# Patient Record
Sex: Female | Born: 1940 | Race: White | Hispanic: No | Smoking: Former smoker
Health system: Southern US, Community
[De-identification: ages and names within clinical notes are randomized; demographics above are authoritative.]

## PROBLEM LIST (undated history)

## (undated) DIAGNOSIS — C679 Malignant neoplasm of bladder, unspecified: Secondary | ICD-10-CM

## (undated) DIAGNOSIS — R350 Frequency of micturition: Secondary | ICD-10-CM

## (undated) DIAGNOSIS — H269 Unspecified cataract: Secondary | ICD-10-CM

## (undated) DIAGNOSIS — C3490 Malignant neoplasm of unspecified part of unspecified bronchus or lung: Secondary | ICD-10-CM

## (undated) DIAGNOSIS — J449 Chronic obstructive pulmonary disease, unspecified: Secondary | ICD-10-CM

## (undated) DIAGNOSIS — R0602 Shortness of breath: Secondary | ICD-10-CM

## (undated) DIAGNOSIS — I10 Essential (primary) hypertension: Secondary | ICD-10-CM

## (undated) DIAGNOSIS — I7121 Aneurysm of the ascending aorta, without rupture: Secondary | ICD-10-CM

## (undated) DIAGNOSIS — Z860100 Personal history of colon polyps, unspecified: Secondary | ICD-10-CM

## (undated) DIAGNOSIS — R351 Nocturia: Secondary | ICD-10-CM

## (undated) DIAGNOSIS — R918 Other nonspecific abnormal finding of lung field: Secondary | ICD-10-CM

## (undated) DIAGNOSIS — I712 Thoracic aortic aneurysm, without rupture: Secondary | ICD-10-CM

## (undated) DIAGNOSIS — M810 Age-related osteoporosis without current pathological fracture: Secondary | ICD-10-CM

## (undated) DIAGNOSIS — M1990 Unspecified osteoarthritis, unspecified site: Secondary | ICD-10-CM

## (undated) DIAGNOSIS — F419 Anxiety disorder, unspecified: Secondary | ICD-10-CM

## (undated) HISTORY — PX: COLONOSCOPY: SHX174

## (undated) HISTORY — PX: TONSILLECTOMY: SUR1361

## (undated) HISTORY — DX: Age-related osteoporosis without current pathological fracture: M81.0

## (undated) HISTORY — DX: Chronic obstructive pulmonary disease, unspecified: J44.9

## (undated) HISTORY — DX: Unspecified cataract: H26.9

## (undated) HISTORY — PX: BLADDER SURGERY: SHX569

## (undated) HISTORY — PX: HAND SURGERY: SHX662

## (undated) HISTORY — PX: CYSTOSCOPY: SUR368

## (undated) HISTORY — PX: CATARACT EXTRACTION, BILATERAL: SHX1313

## (undated) HISTORY — PX: CYSTOSCOPY WITH BIOPSY: SHX5122

## (undated) HISTORY — DX: Malignant neoplasm of bladder, unspecified: C67.9

## (undated) HISTORY — DX: Essential (primary) hypertension: I10

## (undated) HISTORY — DX: Other nonspecific abnormal finding of lung field: R91.8

---

## 1999-09-07 ENCOUNTER — Ambulatory Visit (HOSPITAL_COMMUNITY): Admission: RE | Admit: 1999-09-07 | Discharge: 1999-09-07 | Payer: Self-pay | Admitting: Urology

## 1999-09-07 ENCOUNTER — Encounter (INDEPENDENT_AMBULATORY_CARE_PROVIDER_SITE_OTHER): Payer: Self-pay | Admitting: *Deleted

## 1999-09-07 ENCOUNTER — Encounter: Payer: Self-pay | Admitting: Urology

## 1999-10-13 ENCOUNTER — Ambulatory Visit (HOSPITAL_COMMUNITY): Admission: RE | Admit: 1999-10-13 | Discharge: 1999-10-13 | Payer: Self-pay | Admitting: Interventional Cardiology

## 1999-10-13 ENCOUNTER — Encounter: Payer: Self-pay | Admitting: Interventional Cardiology

## 1999-11-16 ENCOUNTER — Other Ambulatory Visit: Admission: RE | Admit: 1999-11-16 | Discharge: 1999-11-16 | Payer: Self-pay | Admitting: Internal Medicine

## 1999-11-18 ENCOUNTER — Encounter: Payer: Self-pay | Admitting: Internal Medicine

## 1999-11-18 ENCOUNTER — Encounter: Admission: RE | Admit: 1999-11-18 | Discharge: 1999-11-18 | Payer: Self-pay | Admitting: Internal Medicine

## 2000-02-08 ENCOUNTER — Ambulatory Visit (HOSPITAL_COMMUNITY): Admission: RE | Admit: 2000-02-08 | Discharge: 2000-02-08 | Payer: Self-pay | Admitting: Gastroenterology

## 2000-02-08 ENCOUNTER — Encounter (INDEPENDENT_AMBULATORY_CARE_PROVIDER_SITE_OTHER): Payer: Self-pay | Admitting: *Deleted

## 2001-02-21 ENCOUNTER — Ambulatory Visit (HOSPITAL_COMMUNITY): Admission: RE | Admit: 2001-02-21 | Discharge: 2001-02-21 | Payer: Self-pay | Admitting: Sports Medicine

## 2001-02-21 ENCOUNTER — Encounter: Payer: Self-pay | Admitting: Sports Medicine

## 2004-02-17 ENCOUNTER — Other Ambulatory Visit: Admission: RE | Admit: 2004-02-17 | Discharge: 2004-02-17 | Payer: Self-pay | Admitting: Internal Medicine

## 2004-09-29 ENCOUNTER — Emergency Department (HOSPITAL_COMMUNITY): Admission: EM | Admit: 2004-09-29 | Discharge: 2004-09-29 | Payer: Self-pay | Admitting: Family Medicine

## 2004-10-13 ENCOUNTER — Encounter: Admission: RE | Admit: 2004-10-13 | Discharge: 2004-10-28 | Payer: Self-pay | Admitting: Nurse Practitioner

## 2005-02-17 ENCOUNTER — Other Ambulatory Visit: Admission: RE | Admit: 2005-02-17 | Discharge: 2005-02-17 | Payer: Self-pay | Admitting: Internal Medicine

## 2005-04-20 ENCOUNTER — Ambulatory Visit (HOSPITAL_COMMUNITY): Admission: RE | Admit: 2005-04-20 | Discharge: 2005-04-20 | Payer: Self-pay | Admitting: Gastroenterology

## 2006-07-04 ENCOUNTER — Other Ambulatory Visit: Admission: RE | Admit: 2006-07-04 | Discharge: 2006-07-04 | Payer: Self-pay | Admitting: Internal Medicine

## 2007-07-10 ENCOUNTER — Encounter: Payer: Self-pay | Admitting: Urology

## 2007-07-10 ENCOUNTER — Ambulatory Visit (HOSPITAL_BASED_OUTPATIENT_CLINIC_OR_DEPARTMENT_OTHER): Admission: RE | Admit: 2007-07-10 | Discharge: 2007-07-10 | Payer: Self-pay | Admitting: Urology

## 2008-05-05 ENCOUNTER — Other Ambulatory Visit: Admission: RE | Admit: 2008-05-05 | Discharge: 2008-05-05 | Payer: Self-pay | Admitting: Internal Medicine

## 2009-01-30 ENCOUNTER — Emergency Department (HOSPITAL_COMMUNITY): Admission: EM | Admit: 2009-01-30 | Discharge: 2009-01-30 | Payer: Self-pay | Admitting: Family Medicine

## 2010-05-12 ENCOUNTER — Other Ambulatory Visit: Payer: Self-pay | Admitting: Internal Medicine

## 2010-05-12 ENCOUNTER — Other Ambulatory Visit (HOSPITAL_COMMUNITY)
Admission: RE | Admit: 2010-05-12 | Discharge: 2010-05-12 | Disposition: A | Discharge status: Home / Self Care | Payer: Medicare Other | Source: Ambulatory Visit | Attending: Internal Medicine | Admitting: Internal Medicine

## 2010-05-12 DIAGNOSIS — Z1159 Encounter for screening for other viral diseases: Secondary | ICD-10-CM | POA: Insufficient documentation

## 2010-05-12 DIAGNOSIS — Z01419 Encounter for gynecological examination (general) (routine) without abnormal findings: Secondary | ICD-10-CM | POA: Insufficient documentation

## 2010-07-20 NOTE — Op Note (Signed)
NAMEJAQUELINNE, Beverly Francis NO.:  [id number]   MEDICAL RECORD NO.:  [id number]          PATIENT TYPE:  AMB   LOCATION:  [facility]                         FACILITY:  [facility]   PHYSICIAN:  [name]. Annabell Howells, M.D.    DATE OF BIRTH:  1940-04-24   DATE OF PROCEDURE:  07/10/2007  DATE OF DISCHARGE:                               OPERATIVE REPORT   PROCEDURE:  1. Cystoscopy with bilateral retrograde pyelograms and interpretation.  2. Cup biopsy and fulguration of right posterior and mid posterior      bladder wall lesions.   PREOPERATIVE DIAGNOSIS:  History of bladder cancer with bladder wall  lesions.   POSTOPERATIVE DIAGNOSIS:  History of bladder cancer with bladder wall  lesions.   SURGEON:  Bjorn Pippin, MD   ANESTHESIA:  General.   SPECIMEN:  Biopsies from right posterior and mid posterior bladder wall.   COMPLICATIONS:  None.   INDICATIONS:  [name] is a 70 year old white female with a history  of bladder tumors who had her last recurrence in 2004.  On surveillance  cystoscopy, she was found to have 2 small erythematous posterior wall  lesions that were felt to require biopsy.  She had not had upper tract  studies in some time, so it was felt that retrograde pyelography was  indicated as well.   FINDINGS AND PROCEDURE:  The patient is given Cipro.  She was taken to  the operating room, where a general anesthetic was induced.  She was  placed in the lithotomy position.  Her perineum and genitalia were  prepped with Betadine solution and she was draped in the usual sterile  fashion.  Cystoscopy was performed using a 22-French scope and 12- and  70-degrees lenses.  Examination revealed a normal urethra.  The bladder  wall had mild trabeculation.  The mucosa was remarkable only for a small  erythematous lesion on the right posterior wall and on the left  posterior wall; it was difficult to tell if these are neoplastic or  inflammatory.  There is a stellate scar on the  left lateral wall  consistent with her prior biopsy site.  Ureteral orifices were  unremarkable.   After completion of thorough cystoscopic evaluation, she underwent  bilateral retrograde pyelography with a 6-French open-ended catheter and  Hypaque.   The left retrograde pyelogram revealed a normal ureter and intra-renal  collecting system.   The right retrograde pyelogram revealed a normal ureter and intra-renal  collecting system.   After completion of the retrograde pyelograms, cup biopsy forceps were  used to biopsy the lesion on the right posterior wall, which was sent as  a separate specimen, and the mid posterior bladder wall, which was sent  as a separate specimen.   After completion of the biopsies, the biopsy sites were fulgurated with  a Bugbee electrode.  At this point, the bladder was drained, the  cystoscope was removed and the patient was taken down from lithotomy  position, her anesthetic was reversed and she was moved to the recovery  room in stable condition.  There were no complications.  [name]. Annabell Howells, M.D.  Electronically Signed     JJW/MEDQ  D:  07/10/2007  T:  07/10/2007  Job:  [id number]

## 2010-07-23 NOTE — Op Note (Signed)
[place]  Patient:    Beverly Francis, Beverly Francis                    MRN: [id number] Proc. Date: 09/07/99 Adm. Date:  [id number] Disc. Date: [id number] Attending:  Evlyn Clines                           Operative Report  PREOPERATIVE DIAGNOSIS:  Recurrent transitional cell carcinoma of the bladder.  POSTOPERATIVE DIAGNOSIS:  Recurrent transitional cell carcinoma of the bladder.  OPERATION PERFORMED:  Cystoscopy, bilateral retrograde pyelograms, transurethral resection of small bladder tumor.  SURGEON:  [name]. Annabell Howells, M.D.  ANESTHESIA:  General.  SPECIMENS:  Tumor.  COMPLICATIONS:  None.  INDICATIONS FOR PROCEDURE:  The patient is a 70 year old white female with a history of transitional cell carcinoma of the bladder who was noted on surveillance cystoscopy to have a recurrence just medial to the left ureteral orifice.  It appeared low grade.  The patient had not had any upper tract studies in a couple of years and it was felt that bilateral retrogrades were indicated ____________ TURBT.  DESCRIPTION OF PROCEDURE:  The patient was taken to the operating room where general anesthetic was induced.  She had been given p.o. antibiotic preoperatively.  She was placed in lithotomy position.  The perineum and genitalia were prepped with Betadine solution and she was draped in the usual sterile fashion.  Cystoscopy was performed using a 22 [place] scope and 12 and 70 degree lenses.  Examination confirmed the presence of small tumor just medial to the left ureteral orifice.  It was low grade in appearance.  The remainder of the bladder wall was free of tumors.  The ureteral orifices were in their normal anatomic position effluxing clear urine.  The left ureteral orifice was cannulated with a 5 [place] open end catheter.  Contrast was instilled in retrograde fashion.  The study revealed normal ureter and intrarenal collecting system.  The right ureter  was then cannulated.  Contrast was instilled once again.  She was noted to have a normal ureter and intrarenal collecting system.  At this point the cystoscope was removed and a 28 French continuous flow resectoscope sheath was placed.  This was fitted with an [place] handle with 12 degree lens and 24 [place] loop.  Cutting was set on pure.  The tumor was then resected.  The ureteral orifice was avoided. The bed of the tumor was fulgurated as well as the surrounding mucosa and the tumor fragment which was approximately 0.5 cm across was evacuated with a Tuomy syringe. This was sent for permanent section.  Reinspection of the bladder revealed no active bleeding.  The bladder was drained.  The patient was taken down from lithotomy position, her anesthetic was reversed.  She was removed to the recovery room in stable condition.  There were no complications. DD:  09/07/99 TD:  09/08/99 Job: [id number]

## 2010-07-23 NOTE — Op Note (Signed)
Beverly Francis, CORNIA NO.:  [id number]   MEDICAL RECORD NO.:  [id number]          PATIENT TYPE:  AMB   LOCATION:  ENDO                         FACILITY:  [facility]   PHYSICIAN:  Bernette Redbird, M.D.   DATE OF BIRTH:  01-Jan-1941   DATE OF PROCEDURE:  04/20/2005  DATE OF DISCHARGE:                                 OPERATIVE REPORT   PROCEDURE:  Colonoscopy.   INDICATIONS:  Screening for colon cancer in a 70 year old female with  occasional small volume hematochezia.   FINDINGS:  Normal exam except for moderate sigmoid fixation.   PROCEDURE:  The patient provided written consent for the procedure. Sedation  was fentanyl 75 mcg and Versed 7 mg IV without arrhythmias or desaturation.  The Olympus adjustable tension pediatric video colonoscope was advanced with  moderate difficulty through somewhat fixated angulated sigmoid region, but  with the patient in the supine position and applying some external abdominal  compression I was unstable to enter the terminal ileum which had normal  appearance and pullback was then performed. The quality of prep was  excellent and it is felt that all areas were well seen.   This was normal examination. No polyps, cancer, colitis, vascular  malformations or diverticulosis were noted. Retroflexion in the rectum was  unremarkable as was pullout through the anal canal. I did not see any  evident source of rectal bleeding, with just mild to moderate internal  hemorrhoids been noted.   No biopsies were obtained. The patient tolerated procedure well and there  were no apparent complications.   IMPRESSION:  Normal screening colonoscopy.   PLAN:  Repeat colonoscopy in 10 years, or possibly sooner if the rectal  bleeding persists.           ______________________________  Bernette Redbird, M.D.     RB/MEDQ  D:  04/20/2005  T:  04/20/2005  Job:  [id number]   cc:   Ladell Pier, M.D.  Fax: [phone]

## 2012-07-24 ENCOUNTER — Other Ambulatory Visit: Payer: Self-pay | Admitting: Internal Medicine

## 2012-07-24 ENCOUNTER — Other Ambulatory Visit (HOSPITAL_COMMUNITY)
Admission: RE | Admit: 2012-07-24 | Discharge: 2012-07-24 | Disposition: A | Discharge status: Home / Self Care | Payer: Medicare Other | Source: Ambulatory Visit | Attending: Internal Medicine | Admitting: Internal Medicine

## 2012-07-24 DIAGNOSIS — Z124 Encounter for screening for malignant neoplasm of cervix: Secondary | ICD-10-CM | POA: Insufficient documentation

## 2013-06-25 ENCOUNTER — Other Ambulatory Visit: Payer: Self-pay | Admitting: Sports Medicine

## 2013-06-25 DIAGNOSIS — M25559 Pain in unspecified hip: Secondary | ICD-10-CM

## 2013-06-25 DIAGNOSIS — M25551 Pain in right hip: Secondary | ICD-10-CM

## 2013-06-25 DIAGNOSIS — M545 Low back pain, unspecified: Secondary | ICD-10-CM

## 2013-06-25 DIAGNOSIS — M25552 Pain in left hip: Principal | ICD-10-CM

## 2013-06-26 ENCOUNTER — Ambulatory Visit
Admission: RE | Admit: 2013-06-26 | Discharge: 2013-06-26 | Disposition: A | Discharge status: Home / Self Care | Payer: Medicare Other | Source: Ambulatory Visit | Attending: Sports Medicine | Admitting: Sports Medicine

## 2013-06-26 DIAGNOSIS — M25552 Pain in left hip: Principal | ICD-10-CM

## 2013-06-26 DIAGNOSIS — M25551 Pain in right hip: Secondary | ICD-10-CM

## 2013-07-02 ENCOUNTER — Other Ambulatory Visit: Payer: Medicare Other

## 2013-09-02 ENCOUNTER — Other Ambulatory Visit: Payer: Self-pay | Admitting: Internal Medicine

## 2013-09-02 DIAGNOSIS — J449 Chronic obstructive pulmonary disease, unspecified: Secondary | ICD-10-CM

## 2013-09-02 DIAGNOSIS — Z720 Tobacco use: Secondary | ICD-10-CM

## 2013-09-09 ENCOUNTER — Ambulatory Visit
Admission: RE | Admit: 2013-09-09 | Discharge: 2013-09-09 | Disposition: A | Discharge status: Home / Self Care | Payer: Medicare Other | Source: Ambulatory Visit | Attending: Internal Medicine | Admitting: Internal Medicine

## 2013-09-09 DIAGNOSIS — J449 Chronic obstructive pulmonary disease, unspecified: Secondary | ICD-10-CM

## 2013-09-09 DIAGNOSIS — Z720 Tobacco use: Secondary | ICD-10-CM

## 2013-09-11 ENCOUNTER — Other Ambulatory Visit (HOSPITAL_COMMUNITY): Payer: Self-pay | Admitting: Internal Medicine

## 2013-09-11 DIAGNOSIS — R918 Other nonspecific abnormal finding of lung field: Secondary | ICD-10-CM

## 2013-09-16 ENCOUNTER — Encounter (HOSPITAL_COMMUNITY)
Admission: RE | Admit: 2013-09-16 | Discharge: 2013-09-16 | Disposition: A | Discharge status: Home / Self Care | Payer: Medicare Other | Source: Ambulatory Visit | Attending: Internal Medicine | Admitting: Internal Medicine

## 2013-09-16 ENCOUNTER — Telehealth: Payer: Self-pay | Admitting: *Deleted

## 2013-09-16 DIAGNOSIS — R222 Localized swelling, mass and lump, trunk: Secondary | ICD-10-CM | POA: Insufficient documentation

## 2013-09-16 DIAGNOSIS — R918 Other nonspecific abnormal finding of lung field: Secondary | ICD-10-CM

## 2013-09-16 LAB — GLUCOSE, CAPILLARY: Glucose-Capillary: 100 mg/dL — ABNORMAL HIGH (ref 70–99)

## 2013-09-16 MED ORDER — FLUDEOXYGLUCOSE F - 18 (FDG) INJECTION: 5.1000 | Freq: Once | INTRAVENOUS | Status: AC | PRN

## 2013-09-16 NOTE — Telephone Encounter (Signed)
Called pt with appt for [place] 09/19/13 unable to reach or leave vm message

## 2013-09-16 NOTE — Telephone Encounter (Signed)
Called cell phone and busy signal received.

## 2013-09-16 NOTE — Telephone Encounter (Signed)
Called family to make appt for [facility] 09/19/13

## 2013-09-17 ENCOUNTER — Telehealth: Payer: Self-pay | Admitting: *Deleted

## 2013-09-17 NOTE — Telephone Encounter (Signed)
Gave appt time and place for [facility] 09/19/13 at 3:45

## 2013-09-17 NOTE — Telephone Encounter (Signed)
Called pt unable to reach

## 2013-09-19 ENCOUNTER — Ambulatory Visit: Payer: Medicare Other | Attending: Surgery | Admitting: Physical Therapy

## 2013-09-19 ENCOUNTER — Institutional Professional Consult (permissible substitution) (INDEPENDENT_AMBULATORY_CARE_PROVIDER_SITE_OTHER): Payer: Medicare Other | Admitting: Thoracic Surgery (Cardiothoracic Vascular Surgery)

## 2013-09-19 ENCOUNTER — Other Ambulatory Visit: Payer: Self-pay

## 2013-09-19 ENCOUNTER — Encounter: Payer: Self-pay | Admitting: Thoracic Surgery (Cardiothoracic Vascular Surgery)

## 2013-09-19 VITALS — BP 110/69 | HR 104 | Resp 20 | Ht 61.0 in | Wt 105.0 lb

## 2013-09-19 DIAGNOSIS — I10 Essential (primary) hypertension: Secondary | ICD-10-CM

## 2013-09-19 DIAGNOSIS — J439 Emphysema, unspecified: Secondary | ICD-10-CM | POA: Insufficient documentation

## 2013-09-19 DIAGNOSIS — J438 Other emphysema: Secondary | ICD-10-CM

## 2013-09-19 DIAGNOSIS — C679 Malignant neoplasm of bladder, unspecified: Secondary | ICD-10-CM | POA: Insufficient documentation

## 2013-09-19 DIAGNOSIS — J432 Centrilobular emphysema: Secondary | ICD-10-CM

## 2013-09-19 DIAGNOSIS — M810 Age-related osteoporosis without current pathological fracture: Secondary | ICD-10-CM

## 2013-09-19 DIAGNOSIS — R222 Localized swelling, mass and lump, trunk: Secondary | ICD-10-CM

## 2013-09-19 DIAGNOSIS — C3490 Malignant neoplasm of unspecified part of unspecified bronchus or lung: Secondary | ICD-10-CM | POA: Insufficient documentation

## 2013-09-19 DIAGNOSIS — R918 Other nonspecific abnormal finding of lung field: Secondary | ICD-10-CM

## 2013-09-19 NOTE — Progress Notes (Signed)
PCP is No primary provider on file. Referring Provider is Polite, Ashby Dawes, MD  Chief Complaint  Patient presents with  . Lung Mass    surgical eval on lung mass, Chest CT 09/09/13, PET Scan 09/16/13    HPI: 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  She recently went to Dr. Delfina Redwood for an annual physical. While there she complained that she was having some shortness of breath. She also was noted to have lost a significant amount of weight. A CT of the chest was done. It showed a 2.5 cm spiculated mass in the superior segment of the left lower lobe. There is a smaller satellite lesion 6 mm in diameter in close proximity. There was no hilar or mediastinal adenopathy. She then had a PET/CT which showed the mass to be hypermetabolic. There was no evidence of regional or distant metastatic disease.  She has smoked about a pack a day for the past 50 years. She says she has been trying to cut back and is down to less than half a pack a day currently. She was given a prescription for Chantix, but has only taken of one of those tablets. She has a chronic cough which is not changed recently. She denies hemoptysis. She does have wheezing. She says that she seldom uses her Advair, but uses her Ventolin for wheezing almost every day. She has lost 7 pounds over the past 6 months, but her weight has been stable over the past 3 months.  She does complain of chest pressure and shortness of breath with exertion. She says that she walks 3 dogs and can go about a mile with them. She also can go up a flight of stairs without difficulty. However when she first starts her walks after a few minutes she'll experience pressure in her chest and shortness of breath. If she then slows down, she can continue walking and her breathing is better as she continues to go along.    Past Medical History  Diagnosis  Date  . Lung mass   . Tobacco abuse   . Hypertension   . COPD (chronic obstructive pulmonary disease)   . Osteoporosis   . Depressed   . Weight loss   . Bladder cancer   . Cataracts, bilateral   . Seasonal allergies     Past Surgical History  Procedure Laterality Date  . Bladder surgery      1996 Dr Jeffie Pollock  . Hand surgery      1996, 2001    Family History  Problem Relation Age of Onset  . Pulmonary embolism Father   . CVA Mother   . Heart disease Brother   . Lung cancer Sister   . Renal cancer Sister     Social History History  Substance Use Topics  . Smoking status: Current Every Day Smoker -- 1.00 packs/day    Types: Cigarettes  . Smokeless tobacco: Not on file  . Alcohol Use: Yes    Current Outpatient Prescriptions  Medication Sig Dispense Refill  . ADVAIR DISKUS 250-50 MCG/DOSE AEPB Inhale 1 puff into the lungs 2 (two) times daily.       Marland Kitchen ALPRAZolam (XANAX) 0.25 MG tablet Take 0.25 mg by mouth at bedtime as needed for anxiety.      Marland Kitchen aspirin EC 81 MG tablet Take 81 mg by mouth daily.      Marland Kitchen  benazepril-hydrochlorthiazide (LOTENSIN HCT) 10-12.5 MG per tablet Take 1 tablet by mouth daily.      . Calcium 600-200 MG-UNIT per tablet Take 1 tablet by mouth daily.      Marland Kitchen EPIPEN 2-PAK 0.3 MG/0.3ML SOAJ injection 0.3 mg once. prn      . ergocalciferol (VITAMIN D2) 50000 UNITS capsule Take 50,000 Units by mouth once a week.      . VENTOLIN HFA 108 (90 BASE) MCG/ACT inhaler Inhale 1-2 puffs into the lungs every 6 (six) hours as needed.       . CHANTIX STARTING MONTH PAK 0.5 MG X 11 & 1 MG X 42 tablet        No current facility-administered medications for this visit.    Allergies  Allergen Reactions  . Bee Venom   . Oxycodone Other (See Comments)    Facial numbness    Review of Systems  Constitutional: Positive for unexpected weight change (lost 7 pounds in 6 montyhs, no change in 3 months).  Respiratory: Positive for cough, shortness of breath (with exertion)  and wheezing.   Cardiovascular: Positive for chest pain (chest pressure with exertion).  Genitourinary: Positive for frequency.  Neurological: Negative.   Hematological: Negative for adenopathy. Does not bruise/bleed easily.  All other systems reviewed and are negative.   BP 110/69  Pulse 104  Resp 20  Ht 5\' 1"  (1.549 m)  Wt 105 lb (47.628 kg)  BMI 19.85 kg/m2  SpO2 95% Physical Exam  Vitals reviewed. Constitutional: She is oriented to person, place, and time. She appears well-developed. No distress.  Thin  HENT:  Head: Normocephalic and atraumatic.  Eyes: EOM are normal. Pupils are equal, round, and reactive to light.  Neck: Neck supple. No thyromegaly present.  Cardiovascular: Normal rate, normal heart sounds and intact distal pulses.   No murmur heard. Slightly irregular  Pulmonary/Chest: Effort normal and breath sounds normal. She has no wheezes. She has no rales.  Abdominal: Soft. There is no tenderness.  Musculoskeletal: Normal range of motion. She exhibits no edema.  Lymphadenopathy:    She has no cervical adenopathy.  Neurological: She is alert and oriented to person, place, and time. No cranial nerve deficit.  No focal motor deficit  Skin: Skin is warm and dry.     Diagnostic Tests: NUCLEAR MEDICINE PET SKULL BASE TO THIGH  TECHNIQUE:  5.1 mCi F-18 FDG was injected intravenously. Full-ring PET imaging  was performed from the skull base to thigh after the radiotracer. CT  data was obtained and used for attenuation correction and anatomic  localization.  FASTING BLOOD GLUCOSE: Value: 100 mg/dl  COMPARISON: CT chest from 09/09/2013.  FINDINGS:  NECK  No hypermetabolic lymph nodes in the neck.  CHEST  The 2.5 x 2.5 cm spiculated irregular nodule in the superior segment  of the left lower lobe is hypermetabolic with SUV max = 9.3. There  is no hypermetabolic left hilar or mediastinal lymphadenopathy.  ABDOMEN/PELVIS  No abnormal hypermetabolic activity within  the liver, pancreas,  adrenal glands, or spleen. No hypermetabolic lymph nodes in the  abdomen or pelvis. Small focus of FDG accumulation along the left  pelvic sidewall is felt represent urine within the left ureter.  2.5 cm water density lesion in the right kidney is not  hypermetabolic and has imaging features compatible with a cyst.  Atherosclerotic calcification noted in the abdominal aorta without  aneurysm.  SKELETON  No focal hypermetabolic activity to suggest skeletal metastasis.  IMPRESSION:  2.5 cm spiculated  nodule in the left lower lobe is hypermetabolic,  consistent with primary bronchogenic carcinoma. No evidence for  hypermetabolic lymphadenopathy in the left hilum or mediastinum. No  evidence for distant metastatic involvement.  Electronically Signed  By: Misty Stanley M.D.  On: 09/16/2013 14:49   Impression: 73 year old woman with a history of tobacco abuse and COPD who is now found to have a 2.5 cm spiculated mass in the superior segment of the left lower lobe. This is hypermetabolic on PET CT. It is highly suspicious for a new primary bronchogenic carcinoma and needs to be treated as such unless it can be proven otherwise. She does have a history of bladder cancer which is been treated with transurethral resections in the past. The lung mass does not appear to be a metastatic lesion.   I think that she is likely a candidate for surgical resection. She does need a formal pulmonary function testing both with and without bronchodilators to assess her pulmonary reserve. We will schedule those.  I also think she should be seen by cardiology preoperatively for clearance. She does not have a history of coronary disease, but her chest pressure and shortness of breath at the beginning of exercise along with the finding of significant coronary calcification on the CT scan do cause some concern.  I did discuss with her potential options for treatment. We discussed the relative  advantages and disadvantages of surgery versus radiation therapy. She is not think that she would be imaged and radiation therapy, even if we determined she was not surgical candidate for some other reason.  My recommendation to her was that we would do a left video-assisted thoracoscopy, wedge resection, possible segmentectomy or lobectomy (depending on PFTs). We also discussed cryo-analgesia of the intercostal nerves for postoperative pain relief.  I discussed the proposed operation with her in detail. We discussed the need for general anesthesia, the incisions to be used, the intraoperative decision making based on frozen section findings, the expected hospital stay and overall recovery. We discussed the indications, risks, benefits, and alternatives (as described above). She understands that the risks of surgery include, but are not limited to death, MI, DVT, PE, bleeding, possible need for blood transfusion, infection, prolonged air leaks, cardiac arrhythmias, as well as the possibility of unforeseeable complications. She is interested in proceeding with surgical resection.  Plan: 1. Cardiology consultation for preoperative clearance  2. Pulmonary function testing with and without bronchodilators  3. Return to office to review the results and discuss timing of surgery.  4. Left VATS, wedge resection, possible segmentectomy or lobectomy, cryo- analgesia of intercostal nerves.

## 2013-09-20 ENCOUNTER — Other Ambulatory Visit: Payer: Self-pay | Admitting: *Deleted

## 2013-09-20 DIAGNOSIS — R222 Localized swelling, mass and lump, trunk: Secondary | ICD-10-CM

## 2013-10-03 ENCOUNTER — Encounter: Payer: Self-pay | Admitting: Interventional Cardiology

## 2013-10-03 ENCOUNTER — Ambulatory Visit (INDEPENDENT_AMBULATORY_CARE_PROVIDER_SITE_OTHER): Payer: Medicare Other | Admitting: Interventional Cardiology

## 2013-10-03 VITALS — BP 116/72 | HR 86 | Ht 63.5 in | Wt 106.0 lb

## 2013-10-03 DIAGNOSIS — R918 Other nonspecific abnormal finding of lung field: Secondary | ICD-10-CM

## 2013-10-03 DIAGNOSIS — I10 Essential (primary) hypertension: Secondary | ICD-10-CM

## 2013-10-03 DIAGNOSIS — R0789 Other chest pain: Secondary | ICD-10-CM

## 2013-10-03 DIAGNOSIS — J438 Other emphysema: Secondary | ICD-10-CM

## 2013-10-03 DIAGNOSIS — I2510 Atherosclerotic heart disease of native coronary artery without angina pectoris: Secondary | ICD-10-CM

## 2013-10-03 DIAGNOSIS — I2584 Coronary atherosclerosis due to calcified coronary lesion: Secondary | ICD-10-CM

## 2013-10-03 DIAGNOSIS — R9431 Abnormal electrocardiogram [ECG] [EKG]: Secondary | ICD-10-CM

## 2013-10-03 DIAGNOSIS — R222 Localized swelling, mass and lump, trunk: Secondary | ICD-10-CM

## 2013-10-03 DIAGNOSIS — J432 Centrilobular emphysema: Secondary | ICD-10-CM

## 2013-10-03 NOTE — Patient Instructions (Signed)
Your physician recommends that you continue on your current medications as directed. Please refer to the Current Medication list given to you today.  Your physician has requested that you have en exercise stress myoview. For further information please visit [email or web address]. Please follow instruction sheet, as given.  Your physician recommends that you schedule a follow-up appointment as needed

## 2013-10-03 NOTE — Progress Notes (Signed)
Patient ID: Beverly Francis, female   DOB: Jan 20, 1941, 73 y.o.   MRN: [id number]    [address], [address] [place], [place]  [address] Phone: [phone] Fax:  [phone]  Date:  10/03/2013   ID:  Beverly Francis, DOB 11-Sep-1940, MRN [id number]  PCP:  No primary provider on file.   ASSESSMENT:  1.Preoperative cardiovascular clearance prior to left lung resection for suspected cancer 2. Coronary artery disease noted by coronary calcifications on recent CT scan, three-vessel 3. Recurring episodes of chest tightness and dyspnea of uncertain etiology. Given her age and coronary calcification, CAD needs to be excluded 4. Abnormal EKG with right axis and inferior Q waves possibly suggesting prior inferior infarct 5. Left upper lobe lung mass, suspected to be cancer 6. Continued tobacco use 7. Hypertension  PLAN:  1.Stress Cardiolite to risk stratify for the presence of high risk coronary disease 2. Baby aspirin daily 3. Clearance for pulmonary resection will be dependent upon cardiac workup. I am highly suspicious that she will have coronary obstructive disease but hopefully low risk in functional category. 4. 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 Of note her for quite some time dating back to when she worked at: [employer]. She has a continuous tightness in the chest intermittently over the past 3 years. There is no exertional component. She was recently found to have an abnormal chest x-ray with a left upper lobe lesion. This is been confirmed by chest CT. Chest CT demonstrated three-vessel coronary calcification. I initially saw the patient 19 years ago because of a EKG that gave the impression of possible inferior infarction. She has never had a clinically significant cardiac presentation. There is no history of myocardial infarction. She has been relatively active and is limited now by dyspnea which she feels is  related to smoking. She denies edema. There is no orthopnea. She does not have palpitations. She has never had syncope.   Wt Readings from Last 3 Encounters:  10/03/13 106 lb (48.081 kg)  09/19/13 105 lb (47.628 kg)     Past Medical History  Diagnosis Date  . Lung mass   . Tobacco abuse   . Hypertension   . COPD (chronic obstructive pulmonary disease)   . Osteoporosis   . Depressed   . Weight loss   . Bladder cancer   . Cataracts, bilateral   . Seasonal allergies     Current Outpatient Prescriptions  Medication Sig Dispense Refill  . ADVAIR DISKUS 250-50 MCG/DOSE AEPB Inhale 1 puff into the lungs 2 (two) times daily.       Marland Kitchen ALPRAZolam (XANAX) 0.25 MG tablet Take 0.25 mg by mouth at bedtime as needed for anxiety.      Marland Kitchen aspirin EC 81 MG tablet Take 81 mg by mouth daily.      . benazepril-hydrochlorthiazide (LOTENSIN HCT) 10-12.5 MG per tablet Take 1 tablet by mouth daily.      . Calcium 600-200 MG-UNIT per tablet Take 1 tablet by mouth daily.      . CHANTIX STARTING MONTH PAK 0.5 MG X 11 & 1 MG X 42 tablet       . EPIPEN 2-PAK 0.3 MG/0.3ML SOAJ injection 0.3 mg once. prn      . ergocalciferol (VITAMIN D2) 50000 UNITS capsule Take 50,000 Units by mouth once a week.      Marland Kitchen ibuprofen (HM IBUPROFEN IB) 200 MG tablet Take 600  mg by mouth daily.      . VENTOLIN HFA 108 (90 BASE) MCG/ACT inhaler Inhale 1-2 puffs into the lungs every 6 (six) hours as needed.        No current facility-administered medications for this visit.    Allergies:    Allergies  Allergen Reactions  . Bee Venom   . Oxycodone Other (See Comments)    Facial numbness    Social History:  The patient  reports that she has been smoking Cigarettes.  She has a 50 pack-year smoking history. She does not have any smokeless tobacco history on file. She reports that she drinks alcohol. She reports that she does not use illicit drugs.   ROS:  Please see the history of present illness.   Weight loss. Appetite remains  good. Chronic back element. Continued cigarette smoking. Denies transient neurological symptoms. Is no peripheral edema, abdominal swelling, or orthopnea   All other systems reviewed and negative.   OBJECTIVE: VS:  Ht 5' 3.5" (1.613 m)  Wt 106 lb (48.081 kg)  BMI 18.48 kg/m2 Well nourished, well developed, in no acute distress, : As stated age and appears chronically ill HEENT: normal Neck: JVD flat. Carotid bruit absent  Cardiac:  normal S1, S2; RRR; no murmur Lungs:  clear to auscultation bilaterally, no wheezing, rhonchi or rales Abd: soft, nontender, no hepatomegaly Ext: Edema absent. Pulses 2+ upper extremities bilateral Skin: warm and dry Neuro:  CNs 2-12 intact, no focal abnormalities noted  EKG:  Inferior infarct, poor R-wave wave progression, rightward axis, and normal sinus rhythm. Inferior Q waves or deeper and wider than on prior tracing from 2001 at [facility] cardiology       Signed, Holloway, MD 10/03/2013 1:27 PM

## 2013-10-04 ENCOUNTER — Ambulatory Visit (HOSPITAL_COMMUNITY)
Admission: RE | Admit: 2013-10-04 | Discharge: 2013-10-04 | Disposition: A | Discharge status: Home / Self Care | Payer: Medicare Other | Source: Ambulatory Visit | Attending: Thoracic Surgery (Cardiothoracic Vascular Surgery) | Admitting: Thoracic Surgery (Cardiothoracic Vascular Surgery)

## 2013-10-04 DIAGNOSIS — R222 Localized swelling, mass and lump, trunk: Secondary | ICD-10-CM | POA: Diagnosis not present

## 2013-10-04 MED ORDER — ALBUTEROL SULFATE (2.5 MG/3ML) 0.083% IN NEBU
2.5000 mg | INHALATION_SOLUTION | Freq: Once | RESPIRATORY_TRACT | Status: AC
  Administered 2013-10-04: 2.5 mg via RESPIRATORY_TRACT

## 2013-10-07 LAB — PULMONARY FUNCTION TEST
DL/VA % pred: 64 %
DL/VA: 3.01 ml/min/mmHg/L
DLCO COR % PRED: 57 %
DLCO UNC: 13.12 ml/min/mmHg
DLCO cor: 13.12 ml/min/mmHg
DLCO unc % pred: 57 %
FEF 25-75 POST: 0.44 L/s
FEF 25-75 PRE: 0.4 L/s
FEF2575-%Change-Post: 9 %
FEF2575-%PRED-PRE: 23 %
FEF2575-%Pred-Post: 25 %
FEV1-%Change-Post: -3 %
FEV1-%Pred-Post: 49 %
FEV1-%Pred-Pre: 50 %
FEV1-Post: 1.02 L
FEV1-Pre: 1.06 L
FEV1FVC-%Change-Post: -3 %
FEV1FVC-%Pred-Pre: 65 %
FEV6-%CHANGE-POST: 1 %
FEV6-%PRED-POST: 80 %
FEV6-%PRED-PRE: 79 %
FEV6-POST: 2.11 L
FEV6-Pre: 2.08 L
FEV6FVC-%Change-Post: 1 %
FEV6FVC-%PRED-POST: 103 %
FEV6FVC-%Pred-Pre: 101 %
FVC-%CHANGE-POST: 0 %
FVC-%PRED-POST: 77 %
FVC-%PRED-PRE: 77 %
FVC-POST: 2.15 L
FVC-PRE: 2.15 L
PRE FEV1/FVC RATIO: 49 %
Post FEV1/FVC ratio: 47 %
Post FEV6/FVC ratio: 98 %
Pre FEV6/FVC Ratio: 97 %
RV % pred: 180 %
RV: 3.97 L
TLC % pred: 128 %
TLC: 6.28 L

## 2013-10-09 ENCOUNTER — Ambulatory Visit (HOSPITAL_COMMUNITY): Payer: Medicare Other | Attending: Cardiovascular Disease | Admitting: Radiology

## 2013-10-09 DIAGNOSIS — I2584 Coronary atherosclerosis due to calcified coronary lesion: Principal | ICD-10-CM

## 2013-10-09 DIAGNOSIS — I2510 Atherosclerotic heart disease of native coronary artery without angina pectoris: Secondary | ICD-10-CM

## 2013-10-09 MED ORDER — TECHNETIUM TC 99M SESTAMIBI GENERIC - CARDIOLITE
30.0000 | Freq: Once | INTRAVENOUS | Status: AC | PRN
  Administered 2013-10-09: 30 via INTRAVENOUS

## 2013-10-10 ENCOUNTER — Ambulatory Visit (HOSPITAL_COMMUNITY): Payer: Medicare Other | Attending: Cardiology | Admitting: Radiology

## 2013-10-10 VITALS — BP 104/65 | HR 66 | Ht 64.0 in | Wt 104.0 lb

## 2013-10-10 DIAGNOSIS — F17200 Nicotine dependence, unspecified, uncomplicated: Secondary | ICD-10-CM | POA: Diagnosis not present

## 2013-10-10 DIAGNOSIS — R0609 Other forms of dyspnea: Secondary | ICD-10-CM | POA: Diagnosis not present

## 2013-10-10 DIAGNOSIS — R0989 Other specified symptoms and signs involving the circulatory and respiratory systems: Secondary | ICD-10-CM | POA: Insufficient documentation

## 2013-10-10 DIAGNOSIS — R079 Chest pain, unspecified: Secondary | ICD-10-CM | POA: Insufficient documentation

## 2013-10-10 DIAGNOSIS — R0602 Shortness of breath: Secondary | ICD-10-CM

## 2013-10-10 DIAGNOSIS — I10 Essential (primary) hypertension: Secondary | ICD-10-CM | POA: Insufficient documentation

## 2013-10-10 DIAGNOSIS — I4949 Other premature depolarization: Secondary | ICD-10-CM

## 2013-10-10 MED ORDER — TECHNETIUM TC 99M SESTAMIBI GENERIC - CARDIOLITE
30.0000 | Freq: Once | INTRAVENOUS | Status: AC | PRN
  Administered 2013-10-10: 30 via INTRAVENOUS

## 2013-10-10 NOTE — Progress Notes (Signed)
[facility] [place], [place] [address] [phone]    Cardiology Nuclear Med Study  Beverly Francis is a 73 y.o. female     MRN : [id number]     DOB: Aug 08, 1940  Procedure Date: 10/10/2013  Nuclear Med Background Indication for Stress Test:  Evaluation for Ischemia, Abnormal EKG, 09-02-2013 CT Chest 3-vessel CAD, and Pending Surgical Clearance for  (L) Lung Resection by Mod[place] Charon, MD History: No prior known history of CAD, 2001 Myocardial Perfusion Imaging-Normal, EF=64% Cardiac Risk Factors: Hypertension and Smoker  Symptoms: Chest Tightness, and  DOE   Nuclear Pre-Procedure Caffeine/Decaff Intake:  None > 12 hrs NPO After: 7:30pm   Lungs:  clear O2 Sat: 98% on room air. IV 0.9% NS with Angio Cath:  22g  IV Site: R Antecubital x 1, tolerated well IV Started by:  Irven Baltimore, RN  Chest Size (in):  34 Cup Size: D  Height: 5\' 4"  (1.626 m)  Weight:  104 lb (47.174 kg)  BMI:  Body mass index is 17.84 kg/(m^2). Tech Comments:  No medications today. Irven Baltimore, RN.    Nuclear Med Study 1 or 2 day study: 2 day  Stress Test Type:  Stress  Reading MD: N/A  Order Authorizing Provider:  Olen Pel MD  Resting Radionuclide: Technetium 45m Sestamibi  Resting Radionuclide Dose: 33.0 mCi  On     10-09-13  Stress Radionuclide:  Technetium 47m Sestamibi  Stress Radionuclide Dose: 33.0 mCi on          10-10-13          Stress Protocol Rest HR: 66 Stress HR: 129  Rest BP: 104/65 Stress BP: 188/90  Exercise Time (min): 4:16 METS: 6.10   Predicted Max HR: 147 bpm % Max HR: 87.76 bpm Rate Pressure Product: 24252   Dose of Adenosine (mg):  n/a Dose of Lexiscan: n/a mg  Dose of Atropine (mg): n/a Dose of Dobutamine: n/a mcg/kg/min (at max HR)  Stress Test Technologist: Perrin Maltese, EMT-P  Nuclear Technologist:  Vedia Pereyra, CNMT     Rest Procedure:  Myocardial perfusion imaging was performed at rest 45 minutes following  the intravenous administration of Technetium 53m Sestamibi. Rest ECG: NSR - Normal EKG  Stress Procedure:  The patient exercised on the treadmill utilizing the Bruce Protocol for 4:16 minutes. The patient stopped due to DOE and denied any chest pain.  Technetium 15m Sestamibi was injected at peak exercise and myocardial perfusion imaging was performed after a brief delay. Stress ECG: No significant ST segment change suggestive of ischemia. and Short run of PAT noted, occasional PVC's during recovery  QPS Raw Data Images:  Normal; no motion artifact; normal heart/lung ratio. Stress Images:  Normal homogeneous uptake in all areas of the myocardium. Rest Images:  Normal homogeneous uptake in all areas of the myocardium. Subtraction (SDS):  No evidence of ischemia. Transient Ischemic Dilatation (Normal <1.22):  1.09 Lung/Heart Ratio (Normal <0.45):  0.25  Quantitative Gated Spect Images QGS EDV:  60 ml QGS ESV:  15 ml  Impression Exercise Capacity:  Poor exercise capacity. BP Response:  Hypertensive blood pressure response. Clinical Symptoms:  There is dyspnea. ECG Impression:  No ischemic EKG changes, 15-beat run of PAT, scattered PVC's Comparison with Prior Nuclear Study: No images to compare  Overall Impression:  Normal stress nuclear study.  Short ~15 beat run of PAT noted during recovery, self-terminated. Occasional PVC's.  LV Ejection Fraction: 74%.  LV Wall Motion:  Normal Wall Motion  Pixie Casino, MD, [facility] Board Certified in Nuclear Cardiology Attending Cardiologist [facility]

## 2013-10-14 ENCOUNTER — Telehealth: Payer: Self-pay | Admitting: Interventional Cardiology

## 2013-10-14 NOTE — Telephone Encounter (Signed)
The stress myocardial perfusion study is unremarkable on 10/11/13: Overall Impression: Normal stress nuclear study. Short ~15 beat run of PAT noted during recovery, self-terminated. Occasional PVC's.  LV Ejection Fraction: 74%. LV Wall Motion: Normal Wall Motion   The patient is Cleared for upcoming pulmonary nodule resection surgery from cardiac standpoint.

## 2013-10-15 ENCOUNTER — Telehealth: Payer: Self-pay

## 2013-10-15 NOTE — Telephone Encounter (Signed)
lmom. myoview was Normal study. Cleared for the upcoming lung resectio

## 2013-10-15 NOTE — Telephone Encounter (Signed)
Message copied by Lamar Laundry on Tue Oct 15, 2013  2:02 PM ------      Message from: Daneen Schick      Created: Fri Oct 11, 2013 10:51 AM       Normal study. Cleared for the upcoming lung resection ------

## 2013-10-15 NOTE — Telephone Encounter (Signed)
Done.Faxed to Dr.Hendrickson's office

## 2013-10-24 ENCOUNTER — Encounter: Payer: Self-pay | Admitting: Thoracic Surgery (Cardiothoracic Vascular Surgery)

## 2013-10-24 ENCOUNTER — Ambulatory Visit (INDEPENDENT_AMBULATORY_CARE_PROVIDER_SITE_OTHER): Payer: Medicare Other | Admitting: Thoracic Surgery (Cardiothoracic Vascular Surgery)

## 2013-10-24 VITALS — BP 125/80 | HR 108 | Ht 64.0 in | Wt 104.0 lb

## 2013-10-24 DIAGNOSIS — R911 Solitary pulmonary nodule: Secondary | ICD-10-CM

## 2013-10-24 MED ORDER — ALPRAZOLAM 0.25 MG PO TABS: 0.2500 mg | ORAL_TABLET | Freq: Every evening | ORAL | Status: DC | PRN

## 2013-10-24 NOTE — Progress Notes (Signed)
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   She recently went to Dr. Delfina Redwood for an annual physical. While there she complained that she was having some shortness of breath. She also was noted to have lost a significant amount of weight. A CT of the chest was done. It showed a 2.5 cm spiculated mass in the superior segment of the left lower lobe. There is a smaller satellite lesion 6 mm in diameter in close proximity. There was no hilar or mediastinal adenopathy. She then had a PET/CT which showed the mass to be hypermetabolic. There was no evidence of regional or distant metastatic disease.   She has smoked about a pack a day for the past 50 years. She says she has been trying to cut back and is down to less than half a pack a day currently. She was given a prescription for Chantix, but has only taken of one of those tablets. She has a chronic cough which is not changed recently. She denies hemoptysis. She does have wheezing. She says that she seldom uses her Advair, but uses her Ventolin for wheezing almost every day. She has lost 7 pounds over the past 6 months, but her weight has been stable over the past 3 months.   She does complain of chest pressure and shortness of breath with exertion. She says that she walks 3 dogs and can go about a mile with them. She also can go up a flight of stairs without difficulty. However when she first starts her walks after a few minutes she'll experience pressure in her chest and shortness of breath. If she then slows down, she can continue walking and her breathing is better as she continues to go along.  I saw her in the office and recommended surgical resection pending PFT and cariology clearance.  Her stress test was interpreted as a low risk study and she was cleared for surgery.  Past Medical History  Diagnosis Date  . Lung mass    . Tobacco abuse   . Hypertension   . COPD (chronic obstructive pulmonary disease)   . Osteoporosis   . Depressed   . Weight loss   . Bladder cancer   . Cataracts, bilateral   . Seasonal allergies     Past Surgical History  Procedure Laterality Date  . Bladder surgery      1996 and 2012 Dr Jeffie Pollock  . Hand surgery      1996, 2001      Current Outpatient Prescriptions  Medication Sig Dispense Refill  . ADVAIR DISKUS 250-50 MCG/DOSE AEPB Inhale 1 puff into the lungs 2 (two) times daily.       Marland Kitchen ALPRAZolam (XANAX) 0.25 MG tablet Take 1 tablet (0.25 mg total) by mouth at bedtime as needed for anxiety.  30 tablet  0  . aspirin EC 81 MG tablet Take 81 mg by mouth daily.      . benazepril-hydrochlorthiazide (LOTENSIN HCT) 10-12.5 MG per tablet Take 1 tablet by mouth daily.      . Calcium 600-200 MG-UNIT per tablet Take 1 tablet by mouth daily.      . CHANTIX STARTING MONTH PAK 0.5 MG X 11 & 1 MG X 42 tablet       . ergocalciferol (VITAMIN D2) 50000 UNITS capsule Take 50,000 Units by mouth once a week.      Marland Kitchen  ibuprofen (HM IBUPROFEN IB) 200 MG tablet Take 600 mg by mouth daily.      . VENTOLIN HFA 108 (90 BASE) MCG/ACT inhaler Inhale 1-2 puffs into the lungs every 6 (six) hours as needed.       Marland Kitchen EPIPEN 2-PAK 0.3 MG/0.3ML SOAJ injection 0.3 mg once. prn       No current facility-administered medications for this visit.   Family History  Problem Relation Age of Onset  . Pulmonary embolism Father   . CVA Mother   . Heart disease Brother   . Lung cancer Sister   . Renal cancer Sister    History   Social History  . Marital Status: Divorced    Spouse Name: N/A    Number of Children: N/A  . Years of Education: N/A   Occupational History  . nurse- RN    Social History Main Topics  . Smoking status: Current Every Day Smoker -- 1.00 packs/day for 50 years    Types: Cigarettes  . Smokeless tobacco: Not on file  . Alcohol Use: Yes  . Drug Use: No  . Sexual Activity: Not on  file   Other Topics Concern  . Not on file   Social History Narrative  . No narrative on file     Physical Exam BP 125/80  Pulse 108  Ht 5\' 4"  (1.626 m)  Wt 104 lb (47.174 kg)  BMI 17.84 kg/m2  SpO82 42% 73 year old woman in no acute distress Well-developed well-nourished Alert and oriented x3 with no focal neurologic deficits No cervical or suprapubic or adenopathy Lungs clear breath sounds Cardiac regular rate and rhythm, with occasional ectopic beat, normal S1 and S2 Abdomen soft nontender Extremities without clubbing cyanosis or edema  Diagnostic Tests: CT CHEST WITHOUT CONTRAST  TECHNIQUE:  Multidetector CT imaging of the chest was performed following the  standard protocol without IV contrast material administration.  COMPARISON: Chest radiograph May 12, 2010  FINDINGS:  There is underlying centrilobular emphysema.  There is a spiculated mass in the superior segment of the left lower  lobe measuring 2.5 x 2.5 cm. There is an immediately adjacent mass  in the superior segment of the left lower lobe measuring 0.8 x 0.6  cm. There is scarring in the inferior lingula. Lungs elsewhere are  clear.  No adenopathy is appreciable on this noncontrast enhanced study.  Subcentimeter mediastinal lymph nodes do not meet size criteria for  pathologic significance.  There is prominence of the ascending thoracic aorta with a maximum  transverse diameter of 4.3 x 4.0 cm. There is atherosclerotic change  in the aorta.  There are multiple foci of coronary artery calcification.  Pericardium is not thickened.  Visualized upper abdominal structures appear normal except for  extensive atherosclerotic change in the aorta and upper abdominal of  arterial vessels. There is degenerative change in the thoracic  spine. There are no blastic or lytic bone lesions. There are healed  rib fractures bilaterally. No thyroid lesions are appreciable.  IMPRESSION:  Spiculated mass in the superior  segment of the left lower lobe with  an adjacent smaller mass. This appearance is felt to be consistent  with neoplasia. There is underlying emphysema. No other parenchymal  lung mass is seen. There is no adenopathy by size criteria. Given  these findings, correlation with PET-CT may be advised to further  assess.  Extensive atherosclerotic change. Mild dilatation of the ascending  thoracic aorta.  These results will be called to the ordering clinician or  representative by the Radiologist Assistant, and communication  documented in the PACS or zVision Dashboard.  Electronically Signed  By: Lowella Grip M.D.  On: 09/09/2013 16:53  NUCLEAR MEDICINE PET SKULL BASE TO THIGH  TECHNIQUE:  5.1 mCi F-18 FDG was injected intravenously. Full-ring PET imaging  was performed from the skull base to thigh after the radiotracer. CT  data was obtained and used for attenuation correction and anatomic  localization.  FASTING BLOOD GLUCOSE: Value: 100 mg/dl  COMPARISON: CT chest from 09/09/2013.  FINDINGS:  NECK  No hypermetabolic lymph nodes in the neck.  CHEST  The 2.5 x 2.5 cm spiculated irregular nodule in the superior segment  of the left lower lobe is hypermetabolic with SUV max = 9.3. There  is no hypermetabolic left hilar or mediastinal lymphadenopathy.  ABDOMEN/PELVIS  No abnormal hypermetabolic activity within the liver, pancreas,  adrenal glands, or spleen. No hypermetabolic lymph nodes in the  abdomen or pelvis. Small focus of FDG accumulation along the left  pelvic sidewall is felt represent urine within the left ureter.  2.5 cm water density lesion in the right kidney is not  hypermetabolic and has imaging features compatible with a cyst.  Atherosclerotic calcification noted in the abdominal aorta without  aneurysm.  SKELETON  No focal hypermetabolic activity to suggest skeletal metastasis.  IMPRESSION:  2.5 cm spiculated nodule in the left lower lobe is hypermetabolic,   consistent with primary bronchogenic carcinoma. No evidence for  hypermetabolic lymphadenopathy in the left hilum or mediastinum. No  evidence for distant metastatic involvement.  Electronically Signed  By: Misty Stanley M.D.  On: 09/16/2013 14:49   PFTs  FVC= 2.15(77%) FEV1= 1.06(50%)- no change with bronchodilators FEV1/FVC= 49% DLCO= 57%  Impression: 73 year old woman with a 2.5 cm spiculated mass in the superior segment of the left lower lobe. This most likely represents a new primary bronchogenic carcinoma. It is hypermetabolic by PET, but there is no evidence of spread.  My recommendation to her was that we would do a left video-assisted thoracoscopy, wedge resection, possible segmentectomy or lobectomy. Given the peripheral nature of the lesion I think we could get an adequate resection with a segmentectomy.   While she would probably be able to tolerate a lobectomy I think it would have a major impact on her quality-of-life and would avoid it unless there is no other option intraoperatively due to margins.   We also discussed cryo-analgesia of the intercostal nerves for postoperative pain relief.   I discussed the proposed operation with her in detail. We discussed the need for general anesthesia, the incisions to be used, the intraoperative decision making based on frozen section findings, the expected hospital stay and overall recovery. We discussed the indications, risks, benefits, and alternatives (as described above). She understands that the risks of surgery include, but are not limited to death, MI, DVT, PE, bleeding, possible need for blood transfusion, infection, prolonged air leaks, cardiac arrhythmias, as well as the possibility of unforeseeable complications. She is interested in proceeding with surgical resection.  Plan: Left VATS, wedge resection, possible superior segmentectomy of the lower lobe, cryo- analgesia of intercostal nerves on Wednesday, September 9.

## 2013-10-25 ENCOUNTER — Ambulatory Visit: Payer: Medicare Other | Admitting: Thoracic Surgery (Cardiothoracic Vascular Surgery)

## 2013-10-25 ENCOUNTER — Other Ambulatory Visit: Payer: Self-pay

## 2013-10-25 DIAGNOSIS — R918 Other nonspecific abnormal finding of lung field: Secondary | ICD-10-CM

## 2013-11-07 ENCOUNTER — Encounter (HOSPITAL_COMMUNITY): Payer: Self-pay | Admitting: Pharmacy Technician

## 2013-11-08 ENCOUNTER — Encounter (HOSPITAL_COMMUNITY): Payer: Self-pay

## 2013-11-08 ENCOUNTER — Encounter (HOSPITAL_COMMUNITY)
Admission: RE | Admit: 2013-11-08 | Discharge: 2013-11-08 | Disposition: A | Discharge status: Home / Self Care | Payer: Medicare Other | Source: Ambulatory Visit | Attending: Thoracic Surgery (Cardiothoracic Vascular Surgery) | Admitting: Thoracic Surgery (Cardiothoracic Vascular Surgery)

## 2013-11-08 VITALS — BP 115/61 | HR 84 | Temp 98.3°F | Resp 18 | Ht 63.0 in | Wt 107.6 lb

## 2013-11-08 DIAGNOSIS — Z01818 Encounter for other preprocedural examination: Secondary | ICD-10-CM | POA: Diagnosis present

## 2013-11-08 DIAGNOSIS — R222 Localized swelling, mass and lump, trunk: Secondary | ICD-10-CM | POA: Insufficient documentation

## 2013-11-08 DIAGNOSIS — R918 Other nonspecific abnormal finding of lung field: Secondary | ICD-10-CM

## 2013-11-08 HISTORY — DX: Shortness of breath: R06.02

## 2013-11-08 HISTORY — DX: Frequency of micturition: R35.0

## 2013-11-08 HISTORY — DX: Nocturia: R35.1

## 2013-11-08 HISTORY — DX: Anxiety disorder, unspecified: F41.9

## 2013-11-08 HISTORY — DX: Personal history of colon polyps, unspecified: Z86.0100

## 2013-11-08 HISTORY — DX: Unspecified osteoarthritis, unspecified site: M19.90

## 2013-11-08 HISTORY — DX: Personal history of colonic polyps: Z86.010

## 2013-11-08 LAB — COMPREHENSIVE METABOLIC PANEL
ALT: 10 U/L (ref 0–35)
ANION GAP: 15 (ref 5–15)
AST: 19 U/L (ref 0–37)
Albumin: 3.7 g/dL (ref 3.5–5.2)
Alkaline Phosphatase: 68 U/L (ref 39–117)
BILIRUBIN TOTAL: 0.3 mg/dL (ref 0.3–1.2)
BUN: 17 mg/dL (ref 6–23)
CHLORIDE: 102 meq/L (ref 96–112)
CO2: 23 mEq/L (ref 19–32)
Calcium: 8.8 mg/dL (ref 8.4–10.5)
Creatinine, Ser: 0.76 mg/dL (ref 0.50–1.10)
GFR calc non Af Amer: 82 mL/min — ABNORMAL LOW (ref >90)
GLUCOSE: 97 mg/dL (ref 70–99)
POTASSIUM: 4.1 meq/L (ref 3.7–5.3)
SODIUM: 140 meq/L (ref 137–147)
Total Protein: 7.1 g/dL (ref 6.0–8.3)

## 2013-11-08 LAB — CBC
HEMATOCRIT: 38.4 % (ref 36.0–46.0)
Hemoglobin: 12.8 g/dL (ref 12.0–15.0)
MCH: 31.8 pg (ref 26.0–34.0)
MCHC: 33.3 g/dL (ref 30.0–36.0)
MCV: 95.3 fL (ref 78.0–100.0)
Platelets: 264 10*3/uL (ref 150–400)
RBC: 4.03 MIL/uL (ref 3.87–5.11)
RDW: 13.9 % (ref 11.5–15.5)
WBC: 10.1 10*3/uL (ref 4.0–10.5)

## 2013-11-08 LAB — URINALYSIS, ROUTINE W REFLEX MICROSCOPIC
BILIRUBIN URINE: NEGATIVE
Glucose, UA: NEGATIVE mg/dL
Ketones, ur: NEGATIVE mg/dL
LEUKOCYTES UA: NEGATIVE
NITRITE: NEGATIVE
PROTEIN: NEGATIVE mg/dL
Specific Gravity, Urine: 1.016 (ref 1.005–1.030)
UROBILINOGEN UA: 0.2 mg/dL (ref 0.0–1.0)
pH: 5 (ref 5.0–8.0)

## 2013-11-08 LAB — BLOOD GAS, ARTERIAL
Acid-Base Excess: 3.7 mmol/L — ABNORMAL HIGH (ref 0.0–2.0)
Bicarbonate: 27.7 mEq/L — ABNORMAL HIGH (ref 20.0–24.0)
DRAWN BY: 206361
FIO2: 0.21 %
O2 Saturation: 90.6 %
PATIENT TEMPERATURE: 98.6
TCO2: 28.9 mmol/L (ref 0–100)
pCO2 arterial: 41.7 mmHg (ref 35.0–45.0)
pH, Arterial: 7.437 (ref 7.350–7.450)
pO2, Arterial: 65 mmHg — ABNORMAL LOW (ref 80.0–100.0)

## 2013-11-08 LAB — ABO/RH: ABO/RH(D): O NEG

## 2013-11-08 LAB — URINE MICROSCOPIC-ADD ON

## 2013-11-08 LAB — SURGICAL PCR SCREEN
MRSA, PCR: NEGATIVE
STAPHYLOCOCCUS AUREUS: NEGATIVE

## 2013-11-08 LAB — PROTIME-INR
INR: 1.04 (ref 0.00–1.49)
Prothrombin Time: 13.6 seconds (ref 11.6–15.2)

## 2013-11-08 LAB — APTT: aPTT: 31 seconds (ref 24–37)

## 2013-11-08 NOTE — Progress Notes (Addendum)
  Dr.Henry is cardiologist with last visit in July 2015  Denies ever having an echo or heart cath   Stress test in epic from 2001 and 2015  Medical Md is [place]

## 2013-11-08 NOTE — Pre-Procedure Instructions (Signed)
Beverly Francis  11/08/2013   Your procedure is scheduled on:  Wed, Oct 9 @ 8:30 AM  Report to [facility]  at 6:30 AM.  Call this number if you have problems the morning of surgery: [phone]   Remember:   Do not eat food or drink liquids after midnight.   Take these medicines the morning of surgery with A SIP OF WATER: Advair<Bring Your Inhaler With You>,Xanax(Alprazolam),and Ventolin              Stop taking your Ibuprofen. No Goody's,BC's,Aleve,Fish Oil,or any Herbal Medications   Do not wear jewelry, make-up or nail polish.  Do not wear lotions, powders, or perfumes. You may wear deodorant.  Do not shave 48 hours prior to surgery.   Do not bring valuables to the hospital.  [facility] is not responsible                  for any belongings or valuables.               Contacts, dentures or bridgework may not be worn into surgery.  Leave suitcase in the car. After surgery it may be brought to your room.  For patients admitted to the hospital, discharge time is determined by your                treatment team.               [facility] - Preparing for Surgery  Before surgery, you can play an important role.  Because skin is not sterile, your skin needs to be as free of germs as possible.  You can reduce the number of germs on you skin by washing with CHG (chlorahexidine gluconate) soap before surgery.  CHG is an antiseptic cleaner which kills germs and bonds with the skin to continue killing germs even after washing.  Please DO NOT use if you have an allergy to CHG or antibacterial soaps.  If your skin becomes reddened/irritated stop using the CHG and inform your nurse when you arrive at Short Stay.  Do not shave (including legs and underarms) for at least 48 hours prior to the first CHG shower.  You may shave your face.  Please follow these instructions carefully:   1.  Shower with CHG Soap the night before surgery and the                                morning of  Surgery.  2.  If you choose to wash your hair, wash your hair first as usual with your       normal shampoo.  3.  After you shampoo, rinse your hair and body thoroughly to remove the                      Shampoo.  4.  Use CHG as you would any other liquid soap.  You can apply chg directly       to the skin and wash gently with scrungie or a clean washcloth.  5.  Apply the CHG Soap to your body ONLY FROM THE NECK DOWN.        Do not use on open wounds or open sores.  Avoid contact with your eyes,       ears, mouth and genitals (private parts).  Wash genitals (private parts)       with your  normal soap.  6.  Wash thoroughly, paying special attention to the area where your surgery        will be performed.  7.  Thoroughly rinse your body with warm water from the neck down.  8.  DO NOT shower/wash with your normal soap after using and rinsing off       the CHG Soap.  9.  Pat yourself dry with a clean towel.            10.  Wear clean pajamas.            11.  Place clean sheets on your bed the night of your first shower and do not        sleep with pets.  Day of Surgery  Do not apply any lotions/deoderants the morning of surgery.  Please wear clean clothes to the hospital/surgery center.    Special Instructions:    Please read over the following fact sheets that you were given: Pain Booklet, Coughing and Deep Breathing, Blood Transfusion Information, MRSA Information and Surgical Site Infection Prevention

## 2013-11-12 MED ORDER — CEFUROXIME SODIUM 1.5 G IJ SOLR
1.5000 g | INTRAMUSCULAR | Status: AC
  Administered 2013-11-13: 1.5 g via INTRAVENOUS
  Filled 2013-11-12: qty 1.5

## 2013-11-12 NOTE — Progress Notes (Signed)
Anesthesia Chart Review: Patient is a 73 year old female scheduled for left VATS, left lower superior segmentectomy, cryoanalgesia of intercostal nerves on 11/13/13 by Dr. Roxan Hockey. Lesion was hypermetabolic on PET scan concerning for primary bronchogenic carcinoma.  History includes LLL lung lesion, mild dilation of the ascending thoracic aorta (4.3 cm) and coronary calcifications on 09/09/13 chest CT, former smoker, COPD, exertional dyspnea, arthritis, bladder cancer s/p surgeries in '96 and '12, hypertension, anxiety, osteoporosis, nocturia, cataracts, tonsillectomy. PCP is Dr. Delfina Redwood. She was sent to cardiologist Dr. Daneen Schick on 10/03/13 for a preoperative cardiology evaluation due to inferior infarct on EKG, coronary calcifications on CT, with reports of chest pressure. She was cleared following a normal stress test.   EKG on 11/08/13 showed: NSR, right superior axis deviation, pulmonary disease pattern, incomplete right BBB, inferior infarct (age undetermined).  Nuclear stress test on 10/10/13 showed: Overall Impression: Normal stress nuclear study. Short ~15 beat run of PAT noted during recovery, self-terminated. Occasional PVC's. LV Ejection Fraction: 74%. LV Wall Motion: Normal Wall Motion.  PFTs 10/04/13 FVC= 2.15(77%)  FEV1= 1.06(50%)- no change with bronchodilators  FEV1/FVC= 49%  DLCO= 57%  Preoperative labs and ABG noted. She is for a CXR on the day of surgery.  If no acute changes then I would anticipate that she could proceed as planned.  George [facility] Short Stay Center/Anesthesiology Phone [phone] 11/12/2013 9:42 AM

## 2013-11-13 ENCOUNTER — Encounter (HOSPITAL_COMMUNITY): Payer: Medicare Other | Admitting: Vascular Surgery

## 2013-11-13 ENCOUNTER — Inpatient Hospital Stay (HOSPITAL_COMMUNITY): Payer: Medicare Other | Admitting: Certified Registered"

## 2013-11-13 ENCOUNTER — Encounter (HOSPITAL_COMMUNITY): Payer: Self-pay | Admitting: Certified Registered"

## 2013-11-13 ENCOUNTER — Inpatient Hospital Stay (HOSPITAL_COMMUNITY): Payer: Medicare Other

## 2013-11-13 ENCOUNTER — Inpatient Hospital Stay (HOSPITAL_COMMUNITY)
Admission: RE | Admit: 2013-11-13 | Discharge: 2013-11-19 | DRG: 164 | Disposition: A | Discharge status: Skilled Nursing Facility | Payer: Medicare Other | Source: Ambulatory Visit | Attending: Thoracic Surgery (Cardiothoracic Vascular Surgery) | Admitting: Thoracic Surgery (Cardiothoracic Vascular Surgery)

## 2013-11-13 ENCOUNTER — Encounter (HOSPITAL_COMMUNITY)
Admission: RE | Disposition: A | Discharge status: Skilled Nursing Facility | Payer: Self-pay | Source: Ambulatory Visit | Attending: Thoracic Surgery (Cardiothoracic Vascular Surgery)

## 2013-11-13 DIAGNOSIS — F329 Major depressive disorder, single episode, unspecified: Secondary | ICD-10-CM | POA: Diagnosis present

## 2013-11-13 DIAGNOSIS — R634 Abnormal weight loss: Secondary | ICD-10-CM | POA: Diagnosis present

## 2013-11-13 DIAGNOSIS — J9382 Other air leak: Secondary | ICD-10-CM | POA: Diagnosis present

## 2013-11-13 DIAGNOSIS — E872 Acidosis, unspecified: Secondary | ICD-10-CM | POA: Diagnosis present

## 2013-11-13 DIAGNOSIS — C3490 Malignant neoplasm of unspecified part of unspecified bronchus or lung: Secondary | ICD-10-CM | POA: Diagnosis present

## 2013-11-13 DIAGNOSIS — R222 Localized swelling, mass and lump, trunk: Secondary | ICD-10-CM | POA: Diagnosis present

## 2013-11-13 DIAGNOSIS — Z681 Body mass index (BMI) 19 or less, adult: Secondary | ICD-10-CM | POA: Diagnosis not present

## 2013-11-13 DIAGNOSIS — I10 Essential (primary) hypertension: Secondary | ICD-10-CM | POA: Diagnosis present

## 2013-11-13 DIAGNOSIS — M810 Age-related osteoporosis without current pathological fracture: Secondary | ICD-10-CM | POA: Diagnosis present

## 2013-11-13 DIAGNOSIS — E876 Hypokalemia: Secondary | ICD-10-CM | POA: Diagnosis not present

## 2013-11-13 DIAGNOSIS — Z902 Acquired absence of lung [part of]: Secondary | ICD-10-CM

## 2013-11-13 DIAGNOSIS — Z79899 Other long term (current) drug therapy: Secondary | ICD-10-CM | POA: Diagnosis not present

## 2013-11-13 DIAGNOSIS — F17200 Nicotine dependence, unspecified, uncomplicated: Secondary | ICD-10-CM | POA: Diagnosis present

## 2013-11-13 DIAGNOSIS — F3289 Other specified depressive episodes: Secondary | ICD-10-CM | POA: Diagnosis present

## 2013-11-13 DIAGNOSIS — R918 Other nonspecific abnormal finding of lung field: Secondary | ICD-10-CM

## 2013-11-13 DIAGNOSIS — J438 Other emphysema: Secondary | ICD-10-CM | POA: Diagnosis present

## 2013-11-13 DIAGNOSIS — C3430 Malignant neoplasm of lower lobe, unspecified bronchus or lung: Secondary | ICD-10-CM

## 2013-11-13 DIAGNOSIS — D62 Acute posthemorrhagic anemia: Secondary | ICD-10-CM | POA: Diagnosis present

## 2013-11-13 DIAGNOSIS — Z7982 Long term (current) use of aspirin: Secondary | ICD-10-CM

## 2013-11-13 HISTORY — PX: VIDEO ASSISTED THORACOSCOPY: SHX5073

## 2013-11-13 LAB — PREPARE RBC (CROSSMATCH)

## 2013-11-13 SURGERY — VIDEO ASSISTED THORACOSCOPY
Anesthesia: General | Site: Chest | Laterality: Left

## 2013-11-13 MED ORDER — DEXAMETHASONE SODIUM PHOSPHATE 10 MG/ML IJ SOLN
INTRAMUSCULAR | Status: DC | PRN
Start: 2013-11-13 — End: 2013-11-13
  Administered 2013-11-13: 8 mg via INTRAVENOUS

## 2013-11-13 MED ORDER — POTASSIUM CHLORIDE 10 MEQ/50ML IV SOLN
10.0000 meq | Freq: Every day | INTRAVENOUS | Status: DC | PRN
  Administered 2013-11-14 (×3): 10 meq via INTRAVENOUS
  Filled 2013-11-13 (×3): qty 50

## 2013-11-13 MED ORDER — ROCURONIUM BROMIDE 50 MG/5ML IV SOLN
INTRAVENOUS | Status: AC
  Filled 2013-11-13: qty 1

## 2013-11-13 MED ORDER — GLYCOPYRROLATE 0.2 MG/ML IJ SOLN
INTRAMUSCULAR | Status: DC | PRN
  Administered 2013-11-13: 0.6 mg via INTRAVENOUS

## 2013-11-13 MED ORDER — SODIUM CHLORIDE 0.9 % IJ SOLN: 9.0000 mL | INTRAMUSCULAR | Status: DC | PRN

## 2013-11-13 MED ORDER — WHITE PETROLATUM GEL
Status: DC | PRN
  Filled 2013-11-13: qty 5

## 2013-11-13 MED ORDER — ALPRAZOLAM 0.25 MG PO TABS
0.2500 mg | ORAL_TABLET | Freq: Every evening | ORAL | Status: DC | PRN
Start: 2013-11-13 — End: 2013-11-19
  Administered 2013-11-13 – 2013-11-18 (×5): 0.25 mg via ORAL
  Filled 2013-11-13 (×5): qty 1

## 2013-11-13 MED ORDER — HYDROMORPHONE HCL PF 1 MG/ML IJ SOLN
INTRAMUSCULAR | Status: AC
  Filled 2013-11-13: qty 1

## 2013-11-13 MED ORDER — 0.9 % SODIUM CHLORIDE (POUR BTL) OPTIME
TOPICAL | Status: DC | PRN
  Administered 2013-11-13: 2000 mL

## 2013-11-13 MED ORDER — LIDOCAINE HCL (CARDIAC) 20 MG/ML IV SOLN
INTRAVENOUS | Status: DC | PRN
  Administered 2013-11-13: 40 mg via INTRAVENOUS

## 2013-11-13 MED ORDER — FENTANYL CITRATE 0.05 MG/ML IJ SOLN
INTRAMUSCULAR | Status: AC
  Filled 2013-11-13: qty 5

## 2013-11-13 MED ORDER — FENTANYL CITRATE 0.05 MG/ML IJ SOLN
INTRAMUSCULAR | Status: DC | PRN
  Administered 2013-11-13 (×3): 50 ug via INTRAVENOUS

## 2013-11-13 MED ORDER — PHENYLEPHRINE HCL 10 MG/ML IJ SOLN
INTRAMUSCULAR | Status: DC | PRN
  Administered 2013-11-13 (×2): 40 ug via INTRAVENOUS

## 2013-11-13 MED ORDER — ROCURONIUM BROMIDE 100 MG/10ML IV SOLN
INTRAVENOUS | Status: DC | PRN
  Administered 2013-11-13 (×2): 10 mg via INTRAVENOUS
  Administered 2013-11-13: 30 mg via INTRAVENOUS
  Administered 2013-11-13 (×2): 10 mg via INTRAVENOUS

## 2013-11-13 MED ORDER — PROPOFOL 10 MG/ML IV BOLUS
INTRAVENOUS | Status: AC
  Filled 2013-11-13: qty 20

## 2013-11-13 MED ORDER — DEXAMETHASONE SODIUM PHOSPHATE 4 MG/ML IJ SOLN
INTRAMUSCULAR | Status: AC
  Filled 2013-11-13: qty 2

## 2013-11-13 MED ORDER — ONDANSETRON HCL 4 MG/2ML IJ SOLN
INTRAMUSCULAR | Status: AC
  Filled 2013-11-13: qty 2

## 2013-11-13 MED ORDER — ACETAMINOPHEN 160 MG/5ML PO SOLN
1000.0000 mg | Freq: Four times a day (QID) | ORAL | Status: AC
  Filled 2013-11-13: qty 40

## 2013-11-13 MED ORDER — MOMETASONE FURO-FORMOTEROL FUM 100-5 MCG/ACT IN AERO
2.0000 | INHALATION_SPRAY | Freq: Two times a day (BID) | RESPIRATORY_TRACT | Status: DC
  Administered 2013-11-13 – 2013-11-19 (×12): 2 via RESPIRATORY_TRACT
  Filled 2013-11-13: qty 8.8

## 2013-11-13 MED ORDER — PHENYLEPHRINE HCL 10 MG/ML IJ SOLN
10.0000 mg | INTRAVENOUS | Status: DC | PRN
  Administered 2013-11-13: 25 ug/min via INTRAVENOUS

## 2013-11-13 MED ORDER — ARTIFICIAL TEARS OP OINT
TOPICAL_OINTMENT | OPHTHALMIC | Status: AC
  Filled 2013-11-13: qty 3.5

## 2013-11-13 MED ORDER — PHENYLEPHRINE 40 MCG/ML (10ML) SYRINGE FOR IV PUSH (FOR BLOOD PRESSURE SUPPORT)
PREFILLED_SYRINGE | INTRAVENOUS | Status: AC
  Filled 2013-11-13: qty 10

## 2013-11-13 MED ORDER — NALOXONE HCL 0.4 MG/ML IJ SOLN: 0.4000 mg | INTRAMUSCULAR | Status: DC | PRN

## 2013-11-13 MED ORDER — ALBUTEROL SULFATE HFA 108 (90 BASE) MCG/ACT IN AERS: 1.0000 | INHALATION_SPRAY | Freq: Four times a day (QID) | RESPIRATORY_TRACT | Status: DC | PRN

## 2013-11-13 MED ORDER — NEOSTIGMINE METHYLSULFATE 10 MG/10ML IV SOLN
INTRAVENOUS | Status: DC | PRN
  Administered 2013-11-13: 4 mg via INTRAVENOUS

## 2013-11-13 MED ORDER — ONDANSETRON HCL 4 MG/2ML IJ SOLN
INTRAMUSCULAR | Status: DC | PRN
  Administered 2013-11-13: 4 mg via INTRAVENOUS

## 2013-11-13 MED ORDER — SODIUM CHLORIDE 0.9 % IV SOLN: Freq: Once | INTRAVENOUS | Status: DC

## 2013-11-13 MED ORDER — TRAMADOL HCL 50 MG PO TABS
50.0000 mg | ORAL_TABLET | Freq: Four times a day (QID) | ORAL | Status: DC | PRN
  Administered 2013-11-15: 100 mg via ORAL
  Administered 2013-11-15: 50 mg via ORAL
  Administered 2013-11-15 – 2013-11-17 (×7): 100 mg via ORAL
  Administered 2013-11-18: 50 mg via ORAL
  Administered 2013-11-18: 100 mg via ORAL
  Administered 2013-11-18: 50 mg via ORAL
  Administered 2013-11-19 (×2): 100 mg via ORAL
  Filled 2013-11-13: qty 2
  Filled 2013-11-13: qty 1
  Filled 2013-11-13 (×3): qty 2
  Filled 2013-11-13 (×2): qty 1
  Filled 2013-11-13: qty 2
  Filled 2013-11-13: qty 1
  Filled 2013-11-13 (×3): qty 2
  Filled 2013-11-13: qty 1
  Filled 2013-11-13 (×2): qty 2

## 2013-11-13 MED ORDER — DEXTROSE-NACL 5-0.9 % IV SOLN
INTRAVENOUS | Status: DC
  Administered 2013-11-13: 14:00:00 via INTRAVENOUS

## 2013-11-13 MED ORDER — CETYLPYRIDINIUM CHLORIDE 0.05 % MT LIQD: 7.0000 mL | Freq: Two times a day (BID) | OROMUCOSAL | Status: DC

## 2013-11-13 MED ORDER — BISACODYL 5 MG PO TBEC
10.0000 mg | DELAYED_RELEASE_TABLET | Freq: Every day | ORAL | Status: DC
  Administered 2013-11-14 – 2013-11-19 (×6): 10 mg via ORAL
  Filled 2013-11-13 (×6): qty 2

## 2013-11-13 MED ORDER — MIDAZOLAM HCL 2 MG/2ML IJ SOLN
INTRAMUSCULAR | Status: AC
  Filled 2013-11-13: qty 2

## 2013-11-13 MED ORDER — CHLORHEXIDINE GLUCONATE 0.12 % MT SOLN
15.0000 mL | Freq: Two times a day (BID) | OROMUCOSAL | Status: DC
  Administered 2013-11-13: 15 mL via OROMUCOSAL
  Filled 2013-11-13 (×3): qty 15

## 2013-11-13 MED ORDER — ARTIFICIAL TEARS OP OINT
TOPICAL_OINTMENT | OPHTHALMIC | Status: DC | PRN
  Administered 2013-11-13: 1 via OPHTHALMIC

## 2013-11-13 MED ORDER — SENNOSIDES-DOCUSATE SODIUM 8.6-50 MG PO TABS
1.0000 | ORAL_TABLET | Freq: Every day | ORAL | Status: DC
  Administered 2013-11-13 – 2013-11-18 (×6): 1 via ORAL
  Filled 2013-11-13 (×7): qty 1

## 2013-11-13 MED ORDER — ACETAMINOPHEN 500 MG PO TABS
1000.0000 mg | ORAL_TABLET | Freq: Four times a day (QID) | ORAL | Status: AC
  Administered 2013-11-13 – 2013-11-18 (×18): 1000 mg via ORAL
  Filled 2013-11-13 (×19): qty 2

## 2013-11-13 MED ORDER — LACTATED RINGERS IV SOLN
INTRAVENOUS | Status: DC | PRN
  Administered 2013-11-13 (×2): via INTRAVENOUS

## 2013-11-13 MED ORDER — LIDOCAINE HCL (CARDIAC) 20 MG/ML IV SOLN
INTRAVENOUS | Status: AC
  Filled 2013-11-13: qty 5

## 2013-11-13 MED ORDER — FENTANYL 10 MCG/ML IV SOLN
INTRAVENOUS | Status: DC
  Administered 2013-11-13: 12:00:00 via INTRAVENOUS
  Administered 2013-11-13 (×2): 60 ug via INTRAVENOUS
  Administered 2013-11-14: 130 ug via INTRAVENOUS
  Administered 2013-11-14: 40 ug via INTRAVENOUS
  Administered 2013-11-14: 10 ug via INTRAVENOUS
  Administered 2013-11-14: 40 ug via INTRAVENOUS
  Administered 2013-11-14: 70 ug via INTRAVENOUS
  Administered 2013-11-14: 140 ug via INTRAVENOUS
  Administered 2013-11-14: 120 ug via INTRAVENOUS
  Administered 2013-11-15: 20 ug via INTRAVENOUS
  Administered 2013-11-15: 50 ug via INTRAVENOUS
  Administered 2013-11-15: 70 ug via INTRAVENOUS
  Administered 2013-11-15: 80 ug via INTRAVENOUS
  Administered 2013-11-16: 06:00:00 via INTRAVENOUS
  Administered 2013-11-16: 50 ug via INTRAVENOUS
  Administered 2013-11-16: 60 ug via INTRAVENOUS
  Administered 2013-11-16: 50 ug via INTRAVENOUS
  Administered 2013-11-17: 30 ug via INTRAVENOUS
  Administered 2013-11-17: 20 ug via INTRAVENOUS
  Filled 2013-11-13 (×6): qty 50

## 2013-11-13 MED ORDER — DEXTROSE 5 % IV SOLN
1.5000 g | Freq: Two times a day (BID) | INTRAVENOUS | Status: AC
  Administered 2013-11-13 – 2013-11-14 (×2): 1.5 g via INTRAVENOUS
  Filled 2013-11-13 (×2): qty 1.5

## 2013-11-13 MED ORDER — ONDANSETRON HCL 4 MG/2ML IJ SOLN
4.0000 mg | Freq: Four times a day (QID) | INTRAMUSCULAR | Status: DC | PRN
  Administered 2013-11-14: 4 mg via INTRAVENOUS
  Filled 2013-11-13: qty 2

## 2013-11-13 MED ORDER — ALBUTEROL SULFATE (2.5 MG/3ML) 0.083% IN NEBU
2.5000 mg | INHALATION_SOLUTION | Freq: Four times a day (QID) | RESPIRATORY_TRACT | Status: DC | PRN
  Administered 2013-11-14 – 2013-11-15 (×2): 2.5 mg via RESPIRATORY_TRACT
  Filled 2013-11-13 (×2): qty 3

## 2013-11-13 MED ORDER — ONDANSETRON HCL 4 MG/2ML IJ SOLN: 4.0000 mg | Freq: Four times a day (QID) | INTRAMUSCULAR | Status: DC | PRN

## 2013-11-13 MED ORDER — HYDROMORPHONE HCL PF 1 MG/ML IJ SOLN
0.2500 mg | INTRAMUSCULAR | Status: DC | PRN
  Administered 2013-11-13 (×2): 0.5 mg via INTRAVENOUS

## 2013-11-13 MED ORDER — HEMOSTATIC AGENTS (NO CHARGE) OPTIME
TOPICAL | Status: DC | PRN
  Administered 2013-11-13: 1 via TOPICAL

## 2013-11-13 MED ORDER — DIPHENHYDRAMINE HCL 12.5 MG/5ML PO ELIX
12.5000 mg | ORAL_SOLUTION | Freq: Four times a day (QID) | ORAL | Status: DC | PRN
  Filled 2013-11-13: qty 5

## 2013-11-13 MED ORDER — ASPIRIN EC 81 MG PO TBEC
81.0000 mg | DELAYED_RELEASE_TABLET | Freq: Every day | ORAL | Status: DC
  Administered 2013-11-13 – 2013-11-19 (×7): 81 mg via ORAL
  Filled 2013-11-13 (×7): qty 1

## 2013-11-13 MED ORDER — PROMETHAZINE HCL 25 MG/ML IJ SOLN
6.2500 mg | INTRAMUSCULAR | Status: DC | PRN
Start: 2013-11-13 — End: 2013-11-13

## 2013-11-13 MED ORDER — PROPOFOL 10 MG/ML IV BOLUS
INTRAVENOUS | Status: DC | PRN
  Administered 2013-11-13: 100 mg via INTRAVENOUS
  Administered 2013-11-13 (×2): 20 mg via INTRAVENOUS
  Administered 2013-11-13: 50 mg via INTRAVENOUS

## 2013-11-13 MED ORDER — DIPHENHYDRAMINE HCL 50 MG/ML IJ SOLN: 12.5000 mg | Freq: Four times a day (QID) | INTRAMUSCULAR | Status: DC | PRN

## 2013-11-13 SURGICAL SUPPLY — 90 items
ADH SKN CLS APL DERMABOND .7 (GAUZE/BANDAGES/DRESSINGS)
ADH SKN CLS LQ APL DERMABOND (GAUZE/BANDAGES/DRESSINGS) ×2
APPLIER CLIP 5 13 M/L LIGAMAX5 (MISCELLANEOUS) ×3
APPLIER CLIP ROT 10 11.4 M/L (STAPLE)
APR CLP MED LRG 11.4X10 (STAPLE)
APR CLP MED LRG 5 ANG JAW (MISCELLANEOUS) ×2
BAG SPEC RTRVL LRG 6X4 10 (ENDOMECHANICALS) ×2
CANISTER SUCTION 2500CC (MISCELLANEOUS) ×3 IMPLANT
CATH KIT ON Q 5IN SLV (PAIN MANAGEMENT) IMPLANT
CATH THORACIC 28FR (CATHETERS) ×2 IMPLANT
CATH THORACIC 28FR RT ANG (CATHETERS) IMPLANT
CATH THORACIC 36FR (CATHETERS) IMPLANT
CATH THORACIC 36FR RT ANG (CATHETERS) IMPLANT
CLIP APPLIE 5 13 M/L LIGAMAX5 (MISCELLANEOUS) ×1 IMPLANT
CLIP APPLIE ROT 10 11.4 M/L (STAPLE) IMPLANT
CLIP TI MEDIUM 6 (CLIP) IMPLANT
CONN Y 3/8X3/8X3/8  BEN (MISCELLANEOUS) ×1
CONN Y 3/8X3/8X3/8 BEN (MISCELLANEOUS) ×2 IMPLANT
CONT SPEC 4OZ CLIKSEAL STRL BL (MISCELLANEOUS) ×16 IMPLANT
COVER SURGICAL LIGHT HANDLE (MISCELLANEOUS) ×3 IMPLANT
DERMABOND ADHESIVE PROPEN (GAUZE/BANDAGES/DRESSINGS) ×1
DERMABOND ADVANCED (GAUZE/BANDAGES/DRESSINGS)
DERMABOND ADVANCED .7 DNX12 (GAUZE/BANDAGES/DRESSINGS) IMPLANT
DERMABOND ADVANCED .7 DNX6 (GAUZE/BANDAGES/DRESSINGS) ×1 IMPLANT
DRAPE LAPAROSCOPIC ABDOMINAL (DRAPES) ×3 IMPLANT
DRAPE WARM FLUID 44X44 (DRAPE) ×3 IMPLANT
ELECT BLADE 6.5 EXT (BLADE) ×2 IMPLANT
ELECT REM PT RETURN 9FT ADLT (ELECTROSURGICAL) ×3
ELECTRODE REM PT RTRN 9FT ADLT (ELECTROSURGICAL) ×2 IMPLANT
GAUZE SPONGE 4X4 12PLY STRL (GAUZE/BANDAGES/DRESSINGS) ×3 IMPLANT
GLOVE BIO SURGEON STRL SZ7.5 (GLOVE) ×2 IMPLANT
GLOVE SURG SIGNA 7.5 PF LTX (GLOVE) ×6 IMPLANT
GLOVE SURG SS PI 7.0 STRL IVOR (GLOVE) ×4 IMPLANT
GOWN STRL REUS W/ TWL LRG LVL3 (GOWN DISPOSABLE) ×3 IMPLANT
GOWN STRL REUS W/ TWL XL LVL3 (GOWN DISPOSABLE) ×3 IMPLANT
GOWN STRL REUS W/TWL LRG LVL3 (GOWN DISPOSABLE) ×3
GOWN STRL REUS W/TWL XL LVL3 (GOWN DISPOSABLE) ×6
HANDLE STAPLE ENDO GIA SHORT (STAPLE) ×1
HEMOSTAT SURGICEL 2X14 (HEMOSTASIS) IMPLANT
KIT BASIN OR (CUSTOM PROCEDURE TRAY) ×3 IMPLANT
KIT ROOM TURNOVER OR (KITS) ×3 IMPLANT
KIT SUCTION CATH 14FR (SUCTIONS) ×3 IMPLANT
NS IRRIG 1000ML POUR BTL (IV SOLUTION) ×6 IMPLANT
PACK CHEST (CUSTOM PROCEDURE TRAY) ×3 IMPLANT
PAD ARMBOARD 7.5X6 YLW CONV (MISCELLANEOUS) ×6 IMPLANT
PENCIL BUTTON HOLSTER BLD 10FT (ELECTRODE) ×2 IMPLANT
POUCH ENDO CATCH II 15MM (MISCELLANEOUS) IMPLANT
POUCH SPECIMEN RETRIEVAL 10MM (ENDOMECHANICALS) ×2 IMPLANT
PROBE CRYO2-ABLATION MALLABLE (MISCELLANEOUS) ×2 IMPLANT
RELOAD EGIA 45 MED/THCK PURPLE (STAPLE) ×10 IMPLANT
RELOAD EGIA 45 TAN VASC (STAPLE) ×6 IMPLANT
RELOAD EGIA 60 MED/THCK PURPLE (STAPLE) ×3 IMPLANT
RELOAD STAPLE 60 BLK XTHK ART (STAPLE) IMPLANT
RELOAD STAPLE 60 MED/THCK ART (STAPLE) IMPLANT
RELOAD TRI 2.0 60 XTHK VAS SUL (STAPLE) ×6 IMPLANT
SEALANT PROGEL (MISCELLANEOUS) IMPLANT
SEALANT SURG COSEAL 4ML (VASCULAR PRODUCTS) IMPLANT
SEALANT SURG COSEAL 8ML (VASCULAR PRODUCTS) IMPLANT
SOLUTION ANTI FOG 6CC (MISCELLANEOUS) ×3 IMPLANT
SPECIMEN JAR MEDIUM (MISCELLANEOUS) ×3 IMPLANT
SPONGE GAUZE 4X4 12PLY STER LF (GAUZE/BANDAGES/DRESSINGS) ×2 IMPLANT
SPONGE INTESTINAL PEANUT (DISPOSABLE) ×2 IMPLANT
STAPLER ENDO GIA 12 SHRT THIN (STAPLE) IMPLANT
STAPLER ENDO GIA 12MM SHORT (STAPLE) ×2 IMPLANT
SUT PROLENE 4 0 RB 1 (SUTURE)
SUT PROLENE 4-0 RB1 .5 CRCL 36 (SUTURE) IMPLANT
SUT SILK  1 MH (SUTURE) ×3
SUT SILK 1 MH (SUTURE) ×5 IMPLANT
SUT SILK 2 0SH CR/8 30 (SUTURE) IMPLANT
SUT SILK 3 0SH CR/8 30 (SUTURE) IMPLANT
SUT VIC AB 1 CTX 36 (SUTURE) ×3
SUT VIC AB 1 CTX36XBRD ANBCTR (SUTURE) ×1 IMPLANT
SUT VIC AB 2-0 CTX 36 (SUTURE) ×2 IMPLANT
SUT VIC AB 2-0 UR6 27 (SUTURE) IMPLANT
SUT VIC AB 3-0 MH 27 (SUTURE) IMPLANT
SUT VIC AB 3-0 X1 27 (SUTURE) ×3 IMPLANT
SUT VICRYL 2 TP 1 (SUTURE) IMPLANT
SWAB COLLECTION DEVICE MRSA (MISCELLANEOUS) IMPLANT
SYSTEM SAHARA CHEST DRAIN ATS (WOUND CARE) ×3 IMPLANT
TAPE CLOTH 4X10 WHT NS (GAUZE/BANDAGES/DRESSINGS) ×3 IMPLANT
TAPE CLOTH SURG 4X10 WHT LF (GAUZE/BANDAGES/DRESSINGS) ×2 IMPLANT
TIP APPLICATOR SPRAY EXTEND 16 (VASCULAR PRODUCTS) ×2 IMPLANT
TOWEL OR 17X24 6PK STRL BLUE (TOWEL DISPOSABLE) ×3 IMPLANT
TOWEL OR 17X26 10 PK STRL BLUE (TOWEL DISPOSABLE) ×6 IMPLANT
TRAP SPECIMEN MUCOUS 40CC (MISCELLANEOUS) IMPLANT
TRAY FOLEY CATH 16FRSI W/METER (SET/KITS/TRAYS/PACK) ×3 IMPLANT
TROCAR XCEL NON-BLD 5MMX100MML (ENDOMECHANICALS) IMPLANT
TUBE ANAEROBIC SPECIMEN COL (MISCELLANEOUS) IMPLANT
TUNNELER SHEATH ON-Q 11GX8 DSP (PAIN MANAGEMENT) IMPLANT
WATER STERILE IRR 1000ML POUR (IV SOLUTION) ×6 IMPLANT

## 2013-11-13 NOTE — Progress Notes (Signed)
Patient ID: Beverly Francis, female   DOB: Jan 27, 1941, 73 y.o.   MRN: [id number]  SICU Evening Rounds:  Hemodynamically stable. Sats 99%  CT output low. 2 chamber air leak.  Urine output ok.

## 2013-11-13 NOTE — Transfer of Care (Signed)
Immediate Anesthesia Transfer of Care Note  Patient: KAMILLAH DIDONATO  Procedure(s) Performed: Procedure(s): VIDEO ASSISTED THORACOSCOPY, Left lower lobe superior segmentectomy, cryoanalgesia of intercostal nerves, node dissection (Left)  Patient Location: PACU  Anesthesia Type:General  Level of Consciousness: oriented, sedated and patient cooperative  Airway & Oxygen Therapy: Patient Spontanous Breathing and Patient connected to face mask oxygen  Post-op Assessment: Report given to PACU RN, Post -op Vital signs reviewed and stable and Patient moving all extremities X 4  Post vital signs: Reviewed and stable  Complications: No apparent anesthesia complications

## 2013-11-13 NOTE — Brief Op Note (Addendum)
      [address]       [place] [address]             [phone]     11/13/2013  11:29 AM  PATIENT:  Beverly Francis  73 y.o. female  PRE-OPERATIVE DIAGNOSIS:   left lower lobe lung mass  POST-OPERATIVE DIAGNOSIS: Squamous cell cancer, clinical stage IB  PROCEDURE:  Procedure(s): VIDEO ASSISTED THORACOSCOPY, Left lower lobe superior segmentectomy Mediastinal lymph node dissection Cryoanalgesia of intercostal nerves  SURGEON:  Surgeon(s): Melrose Nakayama, MD  PHYSICIAN ASSISTANT: WAYNE GOLD PA-C  ANESTHESIA:   general  SPECIMEN:  Source of Specimen:  LLL SUPERIOR SEGMENT AND MULTIPLE LN'S  DISPOSITION OF SPECIMEN:  Pathology  DRAINS: 2 Chest Tube(s) in the LEFT HEMITHORAX   PATIENT CONDITION:  PACU - hemodynamically stable.  PRE-OPERATIVE WEIGHT: 48kg  FROZEN : SQUAMOUS CELL CARCINOMA, MARGINS CLEAR  COMPLICATIONS: NO KNOWN

## 2013-11-13 NOTE — Anesthesia Preprocedure Evaluation (Addendum)
Anesthesia Evaluation  Patient identified by MRN, date of birth, ID band Patient awake    Reviewed: Allergy & Precautions, H&P , NPO status , Patient's Chart, lab work & pertinent test results  Airway Mallampati: I TM Distance: >3 FB Neck ROM: Full    Dental  (+) Dental Advisory Given   Pulmonary COPD COPD inhaler, former smoker,          Cardiovascular hypertension, + CAD     Neuro/Psych Anxiety negative neurological ROS     GI/Hepatic negative GI ROS, Neg liver ROS,   Endo/Other  negative endocrine ROS  Renal/GU negative Renal ROS  negative genitourinary   Musculoskeletal  (+) Arthritis -,   Abdominal   Peds  Hematology negative hematology ROS (+)   Anesthesia Other Findings   Reproductive/Obstetrics negative OB ROS                          Anesthesia Physical Anesthesia Plan  ASA: III  Anesthesia Plan: General   Post-op Pain Management:    Induction: Intravenous  Airway Management Planned: Oral ETT  Additional Equipment: Arterial line  Intra-op Plan:   Post-operative Plan: Extubation in OR  Informed Consent: I have reviewed the patients History and Physical, chart, labs and discussed the procedure including the risks, benefits and alternatives for the proposed anesthesia with the patient or authorized representative who has indicated his/her understanding and acceptance.   Dental advisory given  Plan Discussed with: CRNA and Anesthesiologist  Anesthesia Plan Comments:         Anesthesia Quick Evaluation

## 2013-11-13 NOTE — Anesthesia Postprocedure Evaluation (Signed)
  Anesthesia Post-op Note  Patient: Beverly Francis  Procedure(s) Performed: Procedure(s): VIDEO ASSISTED THORACOSCOPY, Left lower lobe superior segmentectomy, cryoanalgesia of intercostal nerves, node dissection (Left)  Patient Location: PACU  Anesthesia Type:General  Level of Consciousness: awake, alert  and oriented  Airway and Oxygen Therapy: Patient Spontanous Breathing  Post-op Pain: mild  Post-op Assessment: Post-op Vital signs reviewed  Post-op Vital Signs: Reviewed  Last Vitals:  Filed Vitals:   11/13/13 1300  BP: 108/68  Pulse: 74  Temp:   Resp:     Complications: No apparent anesthesia complications

## 2013-11-13 NOTE — Op Note (Signed)
NAMESHANISHA, Beverly Francis NO.:  [id number]  MEDICAL RECORD NO.:  [id number]  LOCATION:  2S16C                        FACILITY:  [facility]  PHYSICIAN:  Revonda Standard. Roxan Hockey, M.D.DATE OF BIRTH:  March 28, 1940  DATE OF PROCEDURE:  11/13/2013 DATE OF DISCHARGE:                              OPERATIVE REPORT   PREOPERATIVE DIAGNOSIS:  Left lower lobe mass.  POSTOPERATIVE DIAGNOSIS:  Squamous cell carcinoma, clinical stage IA.  PROCEDURE:    Left video-assisted thoracoscopy Cryoanalgesia of intercostal nerves Superior segmentectomy of left lower lobe Mediastinal lymph node dissection.  SURGEON:  Revonda Standard. Roxan Hockey, M.D.  ASSISTANT:  John Giovanni, PA-C.  ANESTHESIA:  General.  FINDINGS:  A 2.5 to 3 cm mass in the superior segment of the left lower lobe with retraction of the visceral pleura.  Frozen section revealed squamous cell carcinoma with clear margins. Enlarged, but otherwise benign- appearing lymph nodes.  CLINICAL NOTE:  Beverly Francis is a 73 year old woman with a history of tobacco abuse who had recently noticed shortness of breath and weight loss.  CT of the chest showed a 2.5 cm spiculated mass in the superior segment of the left lower lobe.  There also was a smaller satellite lesion in close proximity. There was no hilar or mediastinal adenopathy. PET/CT showed the mass to be hypermetabolic with no evidence of metastatic disease.  She was advised to undergo a left video-assisted thoracoscopy for resection of the mass with a plan for wedge resection followed by superior segmentectomy. A lobectomy would be done only if absolutely necessary to obtain adequate margins.  The indications, risks, benefits, and alternatives were discussed in detail with the patient.  She understood and accepted the risks and agreed to proceed.  OPERATIVE NOTE:  Beverly Francis was brought to the preoperative holding area on November 13, 2013.  There, Anesthesia placed a central line  and an arterial blood pressure monitoring line.  She was taken to the operating room, anesthetized, and intubated with a double-lumen endotracheal tube.  Intravenous antibiotics were administered.  Foley catheter was placed.  Sequential compression devices were placed on the calves for DVT prophylaxis.  She was placed in a right lateral decubitus position and the left chest was prepped and draped in the usual sterile fashion.  Single lung ventilation of the right lung was initiated and was tolerated well throughout the procedure.  The chest was prepped and draped in the usual sterile fashion.  An incision was made in the seventh intercostal space in the midaxillary line and was carried through the skin and subcutaneous tissue.  The chest was entered using a 5 mm port.  The 5 mm thoracoscope was advanced into the chest.  There was no pleural effusion.  There was an abnormality of the visceral pleura on the lower lobe in the superior segment in the area where the mass was expected to be.  There were emphysematous changes of the lung.  A small working incision, 5 cm in length, was made in the fourth interspace anterolaterally. No rib spreading was performed during the procedure.  While waiting for the lung to deflate, cryoanalgesia was performed of the intercostal nerves beginning at the third interspace and progressing to  the 8th interspace. The probe was taken to -70 degree Celsius for 2 minutes with approximately 2 cm of the probe over the intercostal nerve.  After performing the cryoanalgesia, dissection was begun in the fissure, however, the lung was not deflating as expected.  The double-lumen endotracheal tube was repositioned and then there was good separation and isolation of the left lung.  The mass was inspected, it was clear that a wedge resection would leave little to no functional lung tissue in the superior segment, so the decision was made to proceed with a superior  segmentectomy.  Dissection was begun in the fissure and was performed with electrocautery.  The fissure was dissected down until the pulmonary artery could be identified.  The superior segmental branch was dissected out.  A second port incision was made anterior to the first to allow the correct angle the pass a stapler across the superior segmental branch artery.  A 45 mm endoscopic vascular stapler was used to divide the superior segmental artery.  The remainder of the major fissure was completed between the superior segment and the upper lobe with sequential firings of the endoscopic GIA stapler.  The inferior ligament was divided, there was no lymph node identified.  The pleural reflection was divided posteriorly, a large 10 L lymph node was encountered, it was dissected off and sent for permanent pathology.  Although it was large, it otherwise appeared normal.  The bronchus then was visible.  The superior segmental bronchus was dissected out.  The accompanying lymph node was taken with the specimen.  A stapler was placed across the superior segmental bronchus and closed.  A test inflation showed good aeration of the remainder of the lower lobe and the upper lobe.  The stapler was fired transecting the segmental bronchus.  Next, the superior segmental vein branch was identified and divided with an endoscopic vascular stapler. The segmentectomy then was completed with sequential firings of an endoscopic GIA stapler using both purple and black cartridges. The gross margin on the tumor was greater than 2 cm.  The superior segment was placed into an endoscopic retrieval bag and removed and sent for frozen section.  While awaiting the results of the frozen section, the level 7 nodes were dissected out.  There was a relatively large node in that [place]. It also appeared benign grossly. A 4 L node and two level 5 lymph nodes were also removed and sent for permanent pathology.  Frozen section  returned showing squamous cell carcinoma with clear margins. The chest was copiously irrigated with warm saline.  A test inflation to 30 cm of water showed air leakage from the upper lobe along the fissure. ProGEL was applied to this area.  A 28-French Blake drain was placed through the original port incision and directed posteriorly and a 28- French chest tube was placed through the second port incision and directed anteriorly, both were secured with #1 silk sutures.  The lung was reinflated.  The utility incision was closed with a #1 Vicryl fascial suture followed by 2-0 Vicryl subcutaneous suture and a 3-0 Vicryl subcuticular suture.  All sponge, needle, and instrument counts were correct at the end of the procedure.  The patient was placed back in a supine position.  She was extubated in the operating room and taken to the postanesthetic care unit in good condition.     Revonda Standard Roxan Hockey, M.D.     SCH/MEDQ  D:  11/13/2013  T:  11/13/2013  Job:  [id number]

## 2013-11-13 NOTE — Anesthesia Procedure Notes (Addendum)
Procedure Name: Intubation Date/Time: 11/13/2013 8:41 AM Performed by: Gaylene Brooks Pre-anesthesia Checklist: Patient identified, Timeout performed, Emergency Drugs available, Suction available and Patient being monitored Patient Re-evaluated:Patient Re-evaluated prior to inductionOxygen Delivery Method: Circle system utilized Preoxygenation: Pre-oxygenation with 100% oxygen Intubation Type: IV induction Ventilation: Mask ventilation without difficulty Grade View: Grade II Endobronchial tube: Left, Double lumen EBT and EBT position confirmed by fiberoptic bronchoscope and 35 Fr Number of attempts: 2 Airway Equipment and Method: Stylet Placement Confirmation: ETT inserted through vocal cords under direct vision,  breath sounds checked- equal and bilateral,  positive ETCO2 and CO2 detector Tube secured with: Tape Dental Injury: Teeth and Oropharynx as per pre-operative assessment       CVP

## 2013-11-13 NOTE — H&P (View-Only) (Signed)
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   She recently went to Dr. Delfina Redwood for an annual physical. While there she complained that she was having some shortness of breath. She also was noted to have lost a significant amount of weight. A CT of the chest was done. It showed a 2.5 cm spiculated mass in the superior segment of the left lower lobe. There is a smaller satellite lesion 6 mm in diameter in close proximity. There was no hilar or mediastinal adenopathy. She then had a PET/CT which showed the mass to be hypermetabolic. There was no evidence of regional or distant metastatic disease.   She has smoked about a pack a day for the past 50 years. She says she has been trying to cut back and is down to less than half a pack a day currently. She was given a prescription for Chantix, but has only taken of one of those tablets. She has a chronic cough which is not changed recently. She denies hemoptysis. She does have wheezing. She says that she seldom uses her Advair, but uses her Ventolin for wheezing almost every day. She has lost 7 pounds over the past 6 months, but her weight has been stable over the past 3 months.   She does complain of chest pressure and shortness of breath with exertion. She says that she walks 3 dogs and can go about a mile with them. She also can go up a flight of stairs without difficulty. However when she first starts her walks after a few minutes she'll experience pressure in her chest and shortness of breath. If she then slows down, she can continue walking and her breathing is better as she continues to go along.  I saw her in the office and recommended surgical resection pending PFT and cariology clearance.  Her stress test was interpreted as a low risk study and she was cleared for surgery.  Past Medical History  Diagnosis Date  . Lung mass    . Tobacco abuse   . Hypertension   . COPD (chronic obstructive pulmonary disease)   . Osteoporosis   . Depressed   . Weight loss   . Bladder cancer   . Cataracts, bilateral   . Seasonal allergies     Past Surgical History  Procedure Laterality Date  . Bladder surgery      1996 and 2012 Dr Jeffie Pollock  . Hand surgery      1996, 2001      Current Outpatient Prescriptions  Medication Sig Dispense Refill  . ADVAIR DISKUS 250-50 MCG/DOSE AEPB Inhale 1 puff into the lungs 2 (two) times daily.       Marland Kitchen ALPRAZolam (XANAX) 0.25 MG tablet Take 1 tablet (0.25 mg total) by mouth at bedtime as needed for anxiety.  30 tablet  0  . aspirin EC 81 MG tablet Take 81 mg by mouth daily.      . benazepril-hydrochlorthiazide (LOTENSIN HCT) 10-12.5 MG per tablet Take 1 tablet by mouth daily.      . Calcium 600-200 MG-UNIT per tablet Take 1 tablet by mouth daily.      . CHANTIX STARTING MONTH PAK 0.5 MG X 11 & 1 MG X 42 tablet       . ergocalciferol (VITAMIN D2) 50000 UNITS capsule Take 50,000 Units by mouth once a week.      Marland Kitchen  ibuprofen (HM IBUPROFEN IB) 200 MG tablet Take 600 mg by mouth daily.      . VENTOLIN HFA 108 (90 BASE) MCG/ACT inhaler Inhale 1-2 puffs into the lungs every 6 (six) hours as needed.       Marland Kitchen EPIPEN 2-PAK 0.3 MG/0.3ML SOAJ injection 0.3 mg once. prn       No current facility-administered medications for this visit.   Family History  Problem Relation Age of Onset  . Pulmonary embolism Father   . CVA Mother   . Heart disease Brother   . Lung cancer Sister   . Renal cancer Sister    History   Social History  . Marital Status: Divorced    Spouse Name: N/A    Number of Children: N/A  . Years of Education: N/A   Occupational History  . nurse- RN    Social History Main Topics  . Smoking status: Current Every Day Smoker -- 1.00 packs/day for 50 years    Types: Cigarettes  . Smokeless tobacco: Not on file  . Alcohol Use: Yes  . Drug Use: No  . Sexual Activity: Not on  file   Other Topics Concern  . Not on file   Social History Narrative  . No narrative on file     Physical Exam BP 125/80  Pulse 108  Ht 5\' 4"  (1.626 m)  Wt 104 lb (47.174 kg)  BMI 17.84 kg/m2  SpO85 84% 73 year old woman in no acute distress Well-developed well-nourished Alert and oriented x3 with no focal neurologic deficits No cervical or suprapubic or adenopathy Lungs clear breath sounds Cardiac regular rate and rhythm, with occasional ectopic beat, normal S1 and S2 Abdomen soft nontender Extremities without clubbing cyanosis or edema  Diagnostic Tests: CT CHEST WITHOUT CONTRAST  TECHNIQUE:  Multidetector CT imaging of the chest was performed following the  standard protocol without IV contrast material administration.  COMPARISON: Chest radiograph May 12, 2010  FINDINGS:  There is underlying centrilobular emphysema.  There is a spiculated mass in the superior segment of the left lower  lobe measuring 2.5 x 2.5 cm. There is an immediately adjacent mass  in the superior segment of the left lower lobe measuring 0.8 x 0.6  cm. There is scarring in the inferior lingula. Lungs elsewhere are  clear.  No adenopathy is appreciable on this noncontrast enhanced study.  Subcentimeter mediastinal lymph nodes do not meet size criteria for  pathologic significance.  There is prominence of the ascending thoracic aorta with a maximum  transverse diameter of 4.3 x 4.0 cm. There is atherosclerotic change  in the aorta.  There are multiple foci of coronary artery calcification.  Pericardium is not thickened.  Visualized upper abdominal structures appear normal except for  extensive atherosclerotic change in the aorta and upper abdominal of  arterial vessels. There is degenerative change in the thoracic  spine. There are no blastic or lytic bone lesions. There are healed  rib fractures bilaterally. No thyroid lesions are appreciable.  IMPRESSION:  Spiculated mass in the superior  segment of the left lower lobe with  an adjacent smaller mass. This appearance is felt to be consistent  with neoplasia. There is underlying emphysema. No other parenchymal  lung mass is seen. There is no adenopathy by size criteria. Given  these findings, correlation with PET-CT may be advised to further  assess.  Extensive atherosclerotic change. Mild dilatation of the ascending  thoracic aorta.  These results will be called to the ordering clinician or  representative by the Radiologist Assistant, and communication  documented in the PACS or zVision Dashboard.  Electronically Signed  By: Lowella Grip M.D.  On: 09/09/2013 16:53  NUCLEAR MEDICINE PET SKULL BASE TO THIGH  TECHNIQUE:  5.1 mCi F-18 FDG was injected intravenously. Full-ring PET imaging  was performed from the skull base to thigh after the radiotracer. CT  data was obtained and used for attenuation correction and anatomic  localization.  FASTING BLOOD GLUCOSE: Value: 100 mg/dl  COMPARISON: CT chest from 09/09/2013.  FINDINGS:  NECK  No hypermetabolic lymph nodes in the neck.  CHEST  The 2.5 x 2.5 cm spiculated irregular nodule in the superior segment  of the left lower lobe is hypermetabolic with SUV max = 9.3. There  is no hypermetabolic left hilar or mediastinal lymphadenopathy.  ABDOMEN/PELVIS  No abnormal hypermetabolic activity within the liver, pancreas,  adrenal glands, or spleen. No hypermetabolic lymph nodes in the  abdomen or pelvis. Small focus of FDG accumulation along the left  pelvic sidewall is felt represent urine within the left ureter.  2.5 cm water density lesion in the right kidney is not  hypermetabolic and has imaging features compatible with a cyst.  Atherosclerotic calcification noted in the abdominal aorta without  aneurysm.  SKELETON  No focal hypermetabolic activity to suggest skeletal metastasis.  IMPRESSION:  2.5 cm spiculated nodule in the left lower lobe is hypermetabolic,   consistent with primary bronchogenic carcinoma. No evidence for  hypermetabolic lymphadenopathy in the left hilum or mediastinum. No  evidence for distant metastatic involvement.  Electronically Signed  By: Misty Stanley M.D.  On: 09/16/2013 14:49   PFTs  FVC= 2.15(77%) FEV1= 1.06(50%)- no change with bronchodilators FEV1/FVC= 49% DLCO= 57%  Impression: 73 year old woman with a 2.5 cm spiculated mass in the superior segment of the left lower lobe. This most likely represents a new primary bronchogenic carcinoma. It is hypermetabolic by PET, but there is no evidence of spread.  My recommendation to her was that we would do a left video-assisted thoracoscopy, wedge resection, possible segmentectomy or lobectomy. Given the peripheral nature of the lesion I think we could get an adequate resection with a segmentectomy.   While she would probably be able to tolerate a lobectomy I think it would have a major impact on her quality-of-life and would avoid it unless there is no other option intraoperatively due to margins.   We also discussed cryo-analgesia of the intercostal nerves for postoperative pain relief.   I discussed the proposed operation with her in detail. We discussed the need for general anesthesia, the incisions to be used, the intraoperative decision making based on frozen section findings, the expected hospital stay and overall recovery. We discussed the indications, risks, benefits, and alternatives (as described above). She understands that the risks of surgery include, but are not limited to death, MI, DVT, PE, bleeding, possible need for blood transfusion, infection, prolonged air leaks, cardiac arrhythmias, as well as the possibility of unforeseeable complications. She is interested in proceeding with surgical resection.  Plan: Left VATS, wedge resection, possible superior segmentectomy of the lower lobe, cryo- analgesia of intercostal nerves on Wednesday, September 9.

## 2013-11-13 NOTE — Interval H&P Note (Signed)
History and Physical Interval Note:  11/13/2013 7:49 AM  Beverly Francis  has presented today for surgery, with the diagnosis of left lower lobe lung mass  The various methods of treatment have been discussed with the patient and family. After consideration of risks, benefits and other options for treatment, the patient has consented to  Procedure(s): VIDEO ASSISTED THORACOSCOPY, Left lower superior segmentectomy, cryoanalgesia of intercostal nerves (Left) SEGMENTECTOMY (Left)  cryoanalgesia of intercostal nerves (Left) as a surgical intervention .  The patient's history has been reviewed, patient examined, no change in status, stable for surgery.  I have reviewed the patient's chart and labs.  Questions were answered to the patient's satisfaction.     HENDRICKSON,STEVEN C

## 2013-11-13 NOTE — Progress Notes (Signed)
UR completed.  Burnetta Kohls, RN BSN [facility] CCM Trauma/Neuro ICU Case Manager [phone]  

## 2013-11-13 NOTE — Progress Notes (Signed)
Report given to maria rn as caregiver 

## 2013-11-14 ENCOUNTER — Encounter (HOSPITAL_COMMUNITY): Payer: Self-pay | Admitting: Thoracic Surgery (Cardiothoracic Vascular Surgery)

## 2013-11-14 ENCOUNTER — Inpatient Hospital Stay (HOSPITAL_COMMUNITY): Payer: Medicare Other

## 2013-11-14 LAB — TYPE AND SCREEN
ABO/RH(D): O NEG
ANTIBODY SCREEN: NEGATIVE
UNIT DIVISION: 0
UNIT DIVISION: 0
UNIT DIVISION: 0
Unit division: 0

## 2013-11-14 LAB — POCT I-STAT 3, ART BLOOD GAS (G3+)
ACID-BASE EXCESS: 2 mmol/L (ref 0.0–2.0)
Bicarbonate: 28.5 mEq/L — ABNORMAL HIGH (ref 20.0–24.0)
O2 Saturation: 94 %
PCO2 ART: 49.7 mmHg — AB (ref 35.0–45.0)
PO2 ART: 73 mmHg — AB (ref 80.0–100.0)
Patient temperature: 98
TCO2: 30 mmol/L (ref 0–100)
pH, Arterial: 7.364 (ref 7.350–7.450)

## 2013-11-14 LAB — BASIC METABOLIC PANEL
ANION GAP: 9 (ref 5–15)
BUN: 11 mg/dL (ref 6–23)
CHLORIDE: 104 meq/L (ref 96–112)
CO2: 27 mEq/L (ref 19–32)
Calcium: 7.5 mg/dL — ABNORMAL LOW (ref 8.4–10.5)
Creatinine, Ser: 0.48 mg/dL — ABNORMAL LOW (ref 0.50–1.10)
GFR calc Af Amer: >90 mL/min (ref >90)
Glucose, Bld: 131 mg/dL — ABNORMAL HIGH (ref 70–99)
Potassium: 3.3 mEq/L — ABNORMAL LOW (ref 3.7–5.3)
Sodium: 140 mEq/L (ref 137–147)

## 2013-11-14 LAB — CBC
HCT: 33.6 % — ABNORMAL LOW (ref 36.0–46.0)
Hemoglobin: 11.2 g/dL — ABNORMAL LOW (ref 12.0–15.0)
MCH: 31.6 pg (ref 26.0–34.0)
MCHC: 33.3 g/dL (ref 30.0–36.0)
MCV: 94.9 fL (ref 78.0–100.0)
PLATELETS: 224 10*3/uL (ref 150–400)
RBC: 3.54 MIL/uL — ABNORMAL LOW (ref 3.87–5.11)
RDW: 14.1 % (ref 11.5–15.5)
WBC: 11.7 10*3/uL — AB (ref 4.0–10.5)

## 2013-11-14 MED ORDER — ENOXAPARIN SODIUM 40 MG/0.4ML ~~LOC~~ SOLN
40.0000 mg | SUBCUTANEOUS | Status: DC
  Administered 2013-11-14 – 2013-11-19 (×6): 40 mg via SUBCUTANEOUS
  Filled 2013-11-14 (×7): qty 0.4

## 2013-11-14 MED ORDER — KCL IN DEXTROSE-NACL 20-5-0.9 MEQ/L-%-% IV SOLN
INTRAVENOUS | Status: DC
  Administered 2013-11-14: 09:00:00 via INTRAVENOUS
  Filled 2013-11-14 (×3): qty 1000

## 2013-11-14 MED ORDER — SODIUM CHLORIDE 0.9 % IJ SOLN: 10.0000 mL | INTRAMUSCULAR | Status: DC | PRN

## 2013-11-14 MED ORDER — POTASSIUM CHLORIDE 10 MEQ/50ML IV SOLN
10.0000 meq | Freq: Once | INTRAVENOUS | Status: AC
  Administered 2013-11-14: 10 meq via INTRAVENOUS
  Filled 2013-11-14: qty 50

## 2013-11-14 MED ORDER — INFLUENZA VAC SPLIT QUAD 0.5 ML IM SUSY: 0.5000 mL | PREFILLED_SYRINGE | INTRAMUSCULAR | Status: DC | PRN

## 2013-11-14 MED ORDER — SODIUM CHLORIDE 0.9 % IJ SOLN
10.0000 mL | Freq: Two times a day (BID) | INTRAMUSCULAR | Status: DC
  Administered 2013-11-14 – 2013-11-17 (×5): 10 mL
  Administered 2013-11-18: 3 mL

## 2013-11-14 NOTE — Progress Notes (Signed)
Transferred to 3S bed 8 via wheelchair, monitor and oxygen. Placed in bed, chest tubes to 20 cm suction. SCDs with pt. RN to receive in room.

## 2013-11-14 NOTE — Progress Notes (Signed)
Report called to 3S RN

## 2013-11-14 NOTE — Progress Notes (Signed)
1 Day Post-Op Procedure(s) (LRB): VIDEO ASSISTED THORACOSCOPY, Left lower lobe superior segmentectomy, cryoanalgesia of intercostal nerves, node dissection (Left) Subjective: C/o pain in left shoulder  Objective: Vital signs in last 24 hours: Temp:  [95.7 F (35.4 C)-98.5 F (36.9 C)] 98.5 F (36.9 C) (09/10 0400) Pulse Rate:  [57-89] 57 (09/10 0700) Cardiac Rhythm:  [-] Normal sinus rhythm (09/10 0700) Resp:  [10-25] 15 (09/10 0742) BP: (87-129)/(40-71) 97/51 mmHg (09/10 0700) SpO2:  [94 %-100 %] 99 % (09/10 0742) Arterial Line BP: (104-162)/(43-77) 110/48 mmHg (09/10 0700) Weight:  [109 lb 12.6 oz (49.8 kg)] 109 lb 12.6 oz (49.8 kg) (09/10 0600)  Hemodynamic parameters for last 24 hours:    Intake/Output from previous day: 09/09 0701 - 09/10 0700 In: 2967 [I.V.:2817; IV Piggyback:150] Out: 1445 [Urine:955; Blood:50; Chest Tube:440] Intake/Output this shift:    General appearance: alert and no distress Neurologic: intact Heart: regular rate and rhythm Lungs: clear to auscultation bilaterally Abdomen: normal findings: soft, non-tender + air leak  Lab Results:  Recent Labs  11/14/13 0335  WBC 11.7*  HGB 11.2*  HCT 33.6*  PLT 224   BMET:  Recent Labs  11/14/13 0335  NA 140  K 3.3*  CL 104  CO2 27  GLUCOSE 131*  BUN 11  CREATININE 0.48*  CALCIUM 7.5*    PT/INR: No results found for this basename: LABPROT, INR,  in the last 72 hours ABG    Component Value Date/Time   PHART 7.364 11/14/2013 0450   HCO3 28.5* 11/14/2013 0450   TCO2 30 11/14/2013 0450   O2SAT 94.0 11/14/2013 0450   CBG (last 3)  No results found for this basename: GLUCAP,  in the last 72 hours  Assessment/Plan: S/P Procedure(s) (LRB): VIDEO ASSISTED THORACOSCOPY, Left lower lobe superior segmentectomy, cryoanalgesia of intercostal nerves, node dissection (Left) Plan for transfer to step-down: see transfer orders  POD # 1- Doing well  CV- in SR BP OK  Wait another day or two before  restarting lisinopril  RESP_ good oxygenation, mild respiratory acidosis  IS, nebs PRN  + air leak- keep CT to suction today- try on water seal tomorrow  RENAL_ hypokalemia- replete K  Anemia secondary to ABL- mild, follow  Start enoxaparin in addition to SCD  Ambulate  Transfer to 3300 when bed available   LOS: 1 day    Esthela Brandner C 11/14/2013

## 2013-11-14 NOTE — Progress Notes (Addendum)
error 

## 2013-11-15 ENCOUNTER — Inpatient Hospital Stay (HOSPITAL_COMMUNITY): Payer: Medicare Other

## 2013-11-15 LAB — COMPREHENSIVE METABOLIC PANEL
ALBUMIN: 2.9 g/dL — AB (ref 3.5–5.2)
ALK PHOS: 54 U/L (ref 39–117)
ALT: 14 U/L (ref 0–35)
AST: 24 U/L (ref 0–37)
Anion gap: 7 (ref 5–15)
BILIRUBIN TOTAL: 0.3 mg/dL (ref 0.3–1.2)
BUN: 8 mg/dL (ref 6–23)
CHLORIDE: 106 meq/L (ref 96–112)
CO2: 28 mEq/L (ref 19–32)
CREATININE: 0.52 mg/dL (ref 0.50–1.10)
Calcium: 7.9 mg/dL — ABNORMAL LOW (ref 8.4–10.5)
GFR calc Af Amer: >90 mL/min (ref >90)
GFR calc non Af Amer: >90 mL/min (ref >90)
Glucose, Bld: 92 mg/dL (ref 70–99)
Potassium: 4.1 mEq/L (ref 3.7–5.3)
Sodium: 141 mEq/L (ref 137–147)
Total Protein: 5.9 g/dL — ABNORMAL LOW (ref 6.0–8.3)

## 2013-11-15 LAB — CBC
HEMATOCRIT: 35.4 % — AB (ref 36.0–46.0)
Hemoglobin: 11.4 g/dL — ABNORMAL LOW (ref 12.0–15.0)
MCH: 31.1 pg (ref 26.0–34.0)
MCHC: 32.2 g/dL (ref 30.0–36.0)
MCV: 96.5 fL (ref 78.0–100.0)
PLATELETS: 227 10*3/uL (ref 150–400)
RBC: 3.67 MIL/uL — AB (ref 3.87–5.11)
RDW: 14.3 % (ref 11.5–15.5)
WBC: 10.2 10*3/uL (ref 4.0–10.5)

## 2013-11-15 MED ORDER — CETYLPYRIDINIUM CHLORIDE 0.05 % MT LIQD
7.0000 mL | Freq: Two times a day (BID) | OROMUCOSAL | Status: DC
  Administered 2013-11-15 – 2013-11-18 (×5): 7 mL via OROMUCOSAL

## 2013-11-15 NOTE — Care Management Note (Signed)
    Page 1 of 1   11/20/2013     9:56:03 AM CARE MANAGEMENT NOTE 11/20/2013  Patient:  REESHA, DEBES   Account Number:  [id number]  Date Initiated:  11/13/2013  Documentation initiated by:  [facility]  Subjective/Objective Assessment:   Admitted post op VATS and LLL segmentation.     Action/Plan:   Anticipated DC Date:  11/20/2013   Anticipated DC Plan:  SKILLED NURSING FACILITY  In-house referral  Clinical Social Worker      DC Planning Services  CM consult      Choice offered to / List presented to:             Status of service:  Completed, signed off Medicare Important Message given?  YES (If response is "NO", the following Medicare IM given date fields will be blank) Date Medicare IM given:  11/15/2013 Medicare IM given by:  GRAVES-BIGELOW,BRENDA Date Additional Medicare IM given:  11/18/2013 Additional Medicare IM given by:  BRENDA GRAVES-BIGELOW  Discharge Disposition:  [place]  Per UR Regulation:  Reviewed for med. necessity/level of care/duration of stay  If discussed at [place] of Stay Meetings, dates discussed:   11/19/2013    Comments:  Contact:  Hutson,John Significant other [phone]                Shanah, Guimaraes     [phone]   11-18-13 [address], [place] [phone] Pt s/p VATS-plan for SNF once medically stable. CSW assisting with disposition needs.

## 2013-11-15 NOTE — Progress Notes (Signed)
[address]       [place] [address]             [phone]          2 Days Post-Op Procedure(s) (LRB): VIDEO ASSISTED THORACOSCOPY, Left lower lobe superior segmentectomy, cryoanalgesia of intercostal nerves, node dissection (Left)  Subjective: Comfortable, no complaints.  Pain well controlled with combo of PCA and po meds.  Breathing stable. Wants to walk more today.   Objective: Vital signs in last 24 hours: Patient Vitals for the past 24 hrs:  BP Temp Temp src Pulse Resp SpO2 Height Weight  11/15/13 0323 143/63 mmHg 97.4 F (36.3 C) Oral 73 12 97 % - -  11/14/13 2357 - - - - 17 95 % - -  11/14/13 2356 128/66 mmHg 97.6 F (36.4 C) Oral 81 15 95 % - -  11/14/13 2300 - - - 62 16 96 % - -  11/14/13 2033 131/61 mmHg 97.8 F (36.6 C) Oral 73 18 93 % - -  11/14/13 2030 - - - - 20 94 % - -  11/14/13 2023 - - - - - 95 % - -  11/14/13 2015 - - - - - 94 % - -  11/14/13 1700 - 98.4 F (36.9 C) Oral - - - - -  11/14/13 1544 - - - - 16 98 % - -  11/14/13 1205 - - - - 19 94 % - -  11/14/13 1200 130/56 mmHg - - 68 19 94 % - -  11/14/13 1100 - - - 76 14 94 % - -  11/14/13 1000 122/76 mmHg - - 67 19 98 % - -  11/14/13 0900 - - - 77 17 99 % - -  11/14/13 0853 - - - - - - 5\' 4"  (1.626 m) 110 lb 3.7 oz (50 kg)  11/14/13 0831 - 98.2 F (36.8 C) Oral - - - - -  11/14/13 0800 125/60 mmHg - - 76 19 99 % - -  11/14/13 0742 - - - - 15 99 % - -   Current Weight  11/14/13 110 lb 3.7 oz (50 kg)     Intake/Output from previous day: 09/10 0701 - 09/11 0700 In: 2052.2 [P.O.:1080; I.V.:872.2; IV Piggyback:100] Out: 1485 [Urine:1140; Chest Tube:345]    PHYSICAL EXAM:  Heart: RRR Lungs: Clear, slightly decreased BS L base Wound: Clean and dry Chest tube: No air leak with cough    Lab Results: CBC: Recent Labs  11/14/13 0335 11/15/13 0425  WBC 11.7* 10.2  HGB 11.2* 11.4*  HCT 33.6* 35.4*  PLT 224 227   BMET:  Recent Labs  11/14/13 0335  11/15/13 0425  NA 140 141  K 3.3* 4.1  CL 104 106  CO2 27 28  GLUCOSE 131* 92  BUN 11 8  CREATININE 0.48* 0.52  CALCIUM 7.5* 7.9*    PT/INR: No results found for this basename: LABPROT, INR,  in the last 72 hours  CXR: stable, no pneumothorax   Assessment/Plan: S/P Procedure(s) (LRB): VIDEO ASSISTED THORACOSCOPY, Left lower lobe superior segmentectomy, cryoanalgesia of intercostal nerves, node dissection (Left) CT with no air leak, CXR stable.  Output decreased, but still 345 ml/24 hrs.  Will place CTs to water seal today and watch. HTN- BPs generally stable. Continue to monitor, may need to resume ACE-I soon. Decrease IVF, ambulate, continue pulm toilet. Disp- pt lives alone and would like to pursue SNF post-d/c.  Will  consult social work.   LOS: 2 days    [name] H 11/15/2013

## 2013-11-15 NOTE — Discharge Instructions (Signed)
Thoracotomy, Care After  These instructions give you information on caring for yourself after your procedure. Your doctor may also give you more specific instructions. Call your doctor if you have any problems or questions after your procedure. HOME CARE  Only take medicine as told by your doctor. Take pain medicine before your pain gets very bad.  Take deep breaths to protect yourself from a lung infection (pneumonia).  Cough often to clear thick spit (mucus) and to open your lungs. Hold a pillow against your chest if it hurts to cough.  Use your breathing machine (incentive spirometer) as told.  Change bandages (dressings) as told by your doctor.  Take off the bandage over your chest tube area as told by your doctor.  Continue your normal diet as told by your doctor.  Eat high-fiber foods. This includes whole grain cereals, brown rice, beans, and fresh fruits and vegetables.  Drink enough fluids to keep your pee (urine) clear or pale yellow. Avoid caffeine.  Talk to your doctor about taking a medicine to soften your poop (stool softener, laxative).  Keep doing activities as told by your doctor. Balance your activity with rest.  Avoid lifting until your doctor says it is okay.  Do not drive until told by your doctor. Do not drive while taking pain medicines (narcotics).  Do not bathe, swim, or use a hot tub until told by your doctor. You may shower. Gently wash the area of your cut (incision) with soap and water as told.  Do not use any tobacco products including cigarettes, chewing tobacco, and electronic cigarettes. Avoid being around others who smoke.  Schedule a doctor visit to get your stitches or staples removed as told.  Schedule and go to all doctor visits as told.  Go to therapy that can help improve your lungs (pulmonary rehabilitation) as told by your doctor.  Do not travel by airplane for 2 weeks after the chest tube is taken out. GET HELP IF:  You are bleeding  from your wounds.  You have redness, puffiness (swelling), or more pain in the wounds.  You feel your heart beating fast or skipping beats (irregular heartbeat).  You have yellowish-white fluid (pus) coming from your wounds.  You have a bad smell coming from the wound or bandage.  You have a fever or chills.  You feel sick to your stomach or you throw up (vomit).  You have muscle aches. GET HELP RIGHT AWAY IF:  You have a rash.  You have trouble breathing.  Your medicines are causing you problems.  You keep feeling sick to your stomach (nauseous).  You feel light-headed.  You have shortness of breath or chest pain.  You have pain that will not go away. MAKE SURE YOU:  Understand these instructions.  Will watch your condition.  Will get help right away if you are not doing well or get worse. Document Released: 08/23/2011 Document Revised: 07/08/2013 Document Reviewed: 10/10/2012 [facility] Patient Information 2015 [place], [place]. This information is not intended to replace advice given to you by your health care provider. Make sure you discuss any questions you have with your health care provider.

## 2013-11-15 NOTE — Discharge Summary (Signed)
[address]       [place] [address]             [phone]              Discharge Summary  Name: Beverly Francis DOB: May 14, 1940 73 y.o. 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 Bladder cancer   . Anxiety     takes Xanax daily as needed  . COPD (chronic obstructive pulmonary disease)     Advair daily and Ventolin as needed  . Hypertension     takes Lisinopril daily  . Lung mass     left  . Shortness of breath     with exertion  . Arthritis   . Osteoporosis   . History of colon polyps   . Urinary frequency   . Nocturia   . Cataracts, bilateral      Procedures: LEFT VIDEO ASSISTED THORACOSCOPY - 11/13/2013  Left lower lobe superior segmentectomy  Cryoanalgesia of intercostal nerves  Lymph node dissection     HPI:  The patient is a 73 y.o. female with a history of tobacco abuse who was referred to Dr. Roxan Hockey for consultation regarding a left lower lobe lung mass. She recently went to Dr. Delfina Redwood for an annual physical. While there, she complained that she was having some shortness of breath. She also was noted to have lost a significant amount of weight. A CT of the chest was done. It showed a 2.5 cm spiculated mass in the superior segment of the left lower lobe. There is a smaller satellite lesion 6 mm in diameter in close proximity. There was no hilar or mediastinal adenopathy. She then had a PET/CT which showed the mass to be hypermetabolic. There was no evidence of regional or distant metastatic disease.  She has smoked about a pack a day for the past 50 years. She says she has been trying to cut back and is down to less than half a pack a day currently. She was given a prescription for Chantix, but has only taken of  one of those tablets. She has a chronic cough which is not changed recently. She denies hemoptysis. She does have wheezing. She says that she seldom uses her Advair, but uses her Ventolin for wheezing almost every day. She has lost 7 pounds over the past 6 months, but her weight has been stable over the past 3 months.   She does complain of chest pressure and shortness of breath with exertion. She says that she walks 3 dogs and can go about a mile with them. She also can go up a flight of stairs without difficulty. However when she first starts her walks after a few minutes she'll experience pressure in her chest and shortness of breath. If she then slows down, she can continue walking and her breathing is better as she continues to go along.   It was recommended that she proceed with a left VATS at this time for resection.  She did undergo cardiology evaluation prior to surgery and was cleared from their standpoint. All risks, benefits and alternatives of surgery were explained in detail, and the patient agreed to proceed.    Hospital Course:  The patient was admitted to [facility] on 11/13/2013. The  patient was taken to the operating room and underwent the above procedure.    The postoperative course has generally been uneventful.  Her chest tubes have been removed in the standard fashion and follow up chest x-rays have been stable. She is ambulating in the halls without difficulty.  Incisions are healing well.  Her pulmonary status has been stable and she has been weaned from supplemental oxygen.  Pathology revealed invasive poorly differentiated squamous cell carcinoma (T1a, N0).  It is felt that she will require a short term stay at a skilled nursing facility after discharge due to lack of assistance at home, and she presently is medically stable for transfer once a bed is available.      Recent vital signs:  Filed Vitals:   11/19/13 0305  BP: 149/77  Pulse: 85  Temp: 97.9 F (36.6 C)  Resp:  19     Recent laboratory studies:  CBC: Recent Labs  11/17/13 0445  WBC 7.1  HGB 12.3  HCT 37.3  PLT 237   BMET:  Recent Labs  11/17/13 0445  NA 137  K 3.9  CL 99  CO2 28  GLUCOSE 80  BUN 11  CREATININE 0.51  CALCIUM 8.5    PT/INR: No results found for this basename: LABPROT, INR,  in the last 72 hours   Discharge Medications:     Medication List    STOP taking these medications       HM IBUPROFEN IB 200 MG tablet  Generic drug:  ibuprofen      TAKE these medications       ADVAIR DISKUS 250-50 MCG/DOSE Aepb  Generic drug:  Fluticasone-Salmeterol  Inhale 1 puff into the lungs daily.     ALPRAZolam 0.25 MG tablet  Commonly known as:  XANAX  Take 1 tablet (0.25 mg total) by mouth at bedtime as needed for anxiety.     aspirin EC 81 MG tablet  Take 81 mg by mouth daily.     Calcium 600-200 MG-UNIT per tablet  Take 1 tablet by mouth daily.     EPIPEN 2-PAK 0.3 mg/0.3 mL Soaj injection  Generic drug:  EPINEPHrine  0.3 mg once. prn     ergocalciferol 50000 UNITS capsule  Commonly known as:  VITAMIN D2  Take 50,000 Units by mouth once a week.     lisinopril 10 MG tablet  Commonly known as:  PRINIVIL,ZESTRIL  Take 10 mg by mouth daily.     traMADol 50 MG tablet  Commonly known as:  ULTRAM  Take 1-2 tablets (50-100 mg total) by mouth every 6 (six) hours as needed for moderate pain (mild pain).     VENTOLIN HFA 108 (90 BASE) MCG/ACT inhaler  Generic drug:  albuterol  Inhale 1-2 puffs into the lungs every 6 (six) hours as needed.         Discharge Instructions:     1. Please obtain vital signs at least one time daily 2. Please weigh the patient daily. If he or she continues to gain weight or develops lower extremity edema, contact the office at [phone]. 3. Ambulate patient at least three times daily. 4. May shower daily and clean incisions with soap and water.  5. May resume regular diet. 6. The patient is to refrain from driving, heavy  lifting or strenuous activity.     Follow Up:       Follow-up Information   Follow up with Melrose Nakayama, MD. (11/26/2013 at 2:45 to see Dr Roxan Hockey  please obtain a chest xray at 1:45 pm at [facility] located in the same office complex.)    Specialty:  Cardiothoracic Surgery   Contact information:   [place] [address] [phone]        Prescott Truex H 11/19/2013, 7:45 AM

## 2013-11-15 NOTE — Clinical Social Work Placement (Addendum)
Clinical Social Work Department CLINICAL SOCIAL WORK PLACEMENT NOTE 11/15/2013  Patient:  LEILI, ESKENAZI  Account Number:  [id number] Admit date:  11/13/2013  Clinical Social Worker:  Paulette Blanch Amora Sheehy, LCSWA  Date/time:  11/15/2013 03:00 PM  Clinical Social Work is seeking post-discharge placement for this patient at the following level of care:   SKILLED NURSING   (*CSW will update this form in Epic as items are completed)   11/15/2013  Patient/family provided with [facility] [place] Department of Clinical Social Work's list of facilities offering this level of care within the geographic area requested by the patient (or if unable, by the patient's family).  11/15/2013  Patient/family informed of their freedom to choose among providers that offer the needed level of care, that participate in Medicare, Medicaid or managed care program needed by the patient, have an available bed and are willing to accept the patient.  11/15/2013  Patient/family informed of [facility]' ownership interest in [facility], as well as of the fact that they are under no obligation to receive care at this facility.  PASARR submitted to EDS on 11/15/2013 PASARR number received on 11/15/2013  FL2 transmitted to all facilities in geographic area requested by pt/family on  11/15/2013 FL2 transmitted to all facilities within larger geographic area on 11/15/2013  Patient informed that his/her managed care company has contracts with or will negotiate with  certain facilities, including the following:     Patient/family informed of bed offers received:  11/18/2013 Patient chooses bed at [facility] Physician recommends and patient chooses bed at    Patient to be transferred to  on   Patient to be transferred to facility by  Patient and family notified of transfer on  Name of family member notified:    The following physician request were entered in Epic:   Additional Comments:  [place], MSW,  [place]

## 2013-11-15 NOTE — Progress Notes (Signed)
Paged Suzzanne Cloud PA at 1330 regarding CXR result of left pneumothorax. Awaiting call back.  Page returned at 1332. No new orders received.

## 2013-11-15 NOTE — Clinical Social Work Psychosocial (Signed)
Clinical Social Work Department BRIEF PSYCHOSOCIAL ASSESSMENT 11/15/2013  Patient:  Beverly Francis, Beverly Francis     Account Number:  [id number]     Admit date:  11/13/2013  Clinical Social Worker:  Marciano Sequin  Date/Time:  11/15/2013 02:50 PM  Referred by:  RN  Date Referred:  11/15/2013 Referred for  SNF Placement   Other Referral:   Interview type:  Patient Other interview type:    PSYCHOSOCIAL DATA Living Status:  ALONE Admitted from facility:   Level of care:  Carrier Primary support name:  Beverly Francis Primary support relationship to patient:  CHILD, ADULT Degree of support available:   Good    CURRENT CONCERNS  Other Concerns:    SOCIAL WORK ASSESSMENT / PLAN CSW met pt at bedside. CSW introduce self and purpose of visit. Pt presented with a bright affect and positive mood. Pt reported that she lives with her 3 days and her son, but her son works a lot. CSW explained the SNF process to the pt. CSW and pt reviewed the SNF list. CSW answered pt's questions regarding length of stay for rehab. CSW provided the pt with contact information for further questions.   Assessment/plan status:  Information/Referral to [employer] Other assessment/ plan:   Information/referral to community resources:   SNF list    PATIENT'S/FAMILY'S RESPONSE TO PLAN OF CARE: agreed     Beverly Francis, MSW, [place]

## 2013-11-16 ENCOUNTER — Inpatient Hospital Stay (HOSPITAL_COMMUNITY): Payer: Medicare Other

## 2013-11-16 NOTE — Evaluation (Signed)
Physical Therapy Evaluation Patient Details Name: Beverly Francis MRN: [id number] DOB: 1940/12/18 Today's Date: 11/16/2013   History of Present Illness  Patient is a 73 yo female s/p Left lower lobe superior segmentectomy, cryoanalgesia of intercostal nerves, node dissection (Left).  Clinical Impression  Patient demonstrates deficits in functional mobility as indicated below. Will benefit from continued skilled PT to address deficits and maximize function. Will see as indicated and progress as tolerated. Patient mobilizing well with minimal assist, but does demonstrate desaturations with oxygen levels on 2 liters (86%).  Educated on breathing techniques. Patient does not have 24/7 assist available and may benefit from short term SNF placement. If progresses significantly, or able to arrange 24/7 assist, may consider d/c home.     Follow Up Recommendations SNF;Supervision/Assistance - 24 hour    Equipment Recommendations  Other (comment) (TBD)    Recommendations for Other Services       Precautions / Restrictions Precautions Precaution Comments: chest tube and supplemental O2 Restrictions Weight Bearing Restrictions: No      Mobility  Bed Mobility               General bed mobility comments: received up in chair  Transfers Overall transfer level: Modified independent                  Ambulation/Gait Ambulation/Gait assistance: Min guard Ambulation Distance (Feet): 340 Feet Assistive device: Rolling walker (2 wheeled) Gait Pattern/deviations: Step-through pattern;Decreased stride length;Trunk flexed;Narrow base of support Gait velocity: decreased Gait velocity interpretation: Below normal speed for age/gender General Gait Details: patient ambulated well with [place] of RW. Required 3 standing rest breaks during ambulation. HR elevated, Spo2 decreased to 86% on 2 liters supplemental O2  Stairs            Wheelchair Mobility    Modified Rankin (Stroke  Patients Only)       Balance Overall balance assessment: Needs assistance Sitting-balance support: Feet supported Sitting balance-Leahy Scale: Good     Standing balance support: During functional activity Standing balance-Leahy Scale: Fair               High level balance activites: Backward walking;Direction changes;Turns High Level Balance Comments: ,im guardf 2/2 impulsivity             Pertinent Vitals/Pain Pain Assessment: 0-10 Pain Score: 2  Pain Location: left flank Pain Descriptors / Indicators: Aching    Home Living Family/patient expects to be discharged to:: Private residence Living Arrangements: Children Available Help at Discharge: Family;Available PRN/intermittently (son works a lot) Type of Home: House                Prior Function Level of Independence: Independent               Hand Dominance   Dominant Hand: Right    Extremity/Trunk Assessment   Upper Extremity Assessment: Generalized weakness           Lower Extremity Assessment: Generalized weakness         Communication   Communication: No difficulties  Cognition Arousal/Alertness: Awake/alert Behavior During Therapy: Impulsive Overall Cognitive Status: No family/caregiver present to determine baseline cognitive functioning                      General Comments General comments (skin integrity, edema, etc.): Various in room functional tasks performed including multiple sit <> stand transfers (x5), Static standing, weight shifting and pre-gait/ generalized ther -ex performed. Spoke with patient extensively about  discharge plan for post acute care.     Exercises General Exercises - Lower Extremity Ankle Circles/Pumps: AROM;Both;10 reps Long Arc Quad: AROM;Both;10 reps      Assessment/Plan    PT Assessment Patient needs continued PT services  PT Diagnosis Difficulty walking;Abnormality of gait;Generalized weakness;Acute pain   PT Problem List  Decreased strength;Decreased activity tolerance;Decreased balance;Decreased mobility;Pain  PT Treatment Interventions DME instruction;Gait training;Stair training;Functional mobility training;Therapeutic activities;Therapeutic exercise;Balance training;Patient/family education   PT Goals (Current goals can be found in the Care Plan section) Acute Rehab PT Goals Patient Stated Goal: to get to be independent and not need people to help me PT Goal Formulation: With patient Time For Goal Achievement: 11/30/13 Potential to Achieve Goals: Good    Frequency Min 3X/week   Barriers to discharge Decreased caregiver support son is not available dur the day, patient unable to arrange initial 24 /7 support    Co-evaluation               End of Session Equipment Utilized During Treatment: Gait belt Activity Tolerance: Patient tolerated treatment well;Patient limited by fatigue Patient left: in chair;with call bell/phone within reach Nurse Communication: Mobility status         Time: 6578-4696 PT Time Calculation (min): 28 min   Charges:   PT Evaluation $Initial PT Evaluation Tier I: 1 Procedure PT Treatments $Gait Training: 8-22 mins $Self Care/Home Management: 8-22   PT G CodesDuncan Dull 11/16/2013, 10:59 AM Alben Deeds, PT DPT  [phone]

## 2013-11-16 NOTE — Progress Notes (Addendum)
TCTS DAILY ICU PROGRESS NOTE                   [address]            [place] [address]          [phone]   3 Days Post-Op Procedure(s) (LRB): VIDEO ASSISTED THORACOSCOPY, Left lower lobe superior segmentectomy, cryoanalgesia of intercostal nerves, node dissection (Left)  Total Length of Stay:  LOS: 3 days   Subjective: Feeling better, mildly prod cough  Objective: Vital signs in last 24 hours: Temp:  [97.8 F (36.6 C)-98.6 F (37 C)] 98.5 F (36.9 C) (09/12 0700) Pulse Rate:  [64-83] 71 (09/12 0400) Cardiac Rhythm:  [-] Normal sinus rhythm (09/11 1930) Resp:  [13-20] 14 (09/12 0400) BP: (131-162)/(55-73) 162/69 mmHg (09/12 0400) SpO2:  [92 %-97 %] 95 % (09/12 0928)  Filed Weights   11/14/13 0600 11/14/13 0853  Weight: 109 lb 12.6 oz (49.8 kg) 110 lb 3.7 oz (50 kg)    Weight change:    Hemodynamic parameters for last 24 hours:    Intake/Output from previous day: 09/11 0701 - 09/12 0700 In: 350.3 [I.V.:350.3] Out: 810 [Urine:350; Chest Tube:460]  Intake/Output this shift:    Current Meds: Scheduled Meds: . acetaminophen  1,000 mg Oral 4 times per day   Or  . acetaminophen (TYLENOL) oral liquid 160 mg/5 mL  1,000 mg Oral 4 times per day  . antiseptic oral rinse  7 mL Mouth Rinse BID  . aspirin EC  81 mg Oral Daily  . bisacodyl  10 mg Oral Daily  . enoxaparin (LOVENOX) injection  40 mg Subcutaneous Q24H  . fentaNYL   Intravenous 6 times per day  . mometasone-formoterol  2 puff Inhalation BID  . senna-docusate  1 tablet Oral QHS  . sodium chloride  10-40 mL Intracatheter Q12H   Continuous Infusions: . dextrose 5 % and 0.9 % NaCl with KCl 20 mEq/L 10 mL/hr at 11/15/13 0847   PRN Meds:.albuterol, ALPRAZolam, diphenhydrAMINE, diphenhydrAMINE, Influenza vac split quadrivalent PF, naloxone, ondansetron (ZOFRAN) IV, ondansetron (ZOFRAN) IV, potassium chloride, sodium chloride, sodium chloride, traMADol, white petrolatum  General appearance:  alert, cooperative and no distress Heart: regular rate and rhythm Lungs: coarse BS, dim in bases Abdomen: benign Extremities: no edema Wound: incis healing well  Lab Results: CBC: Recent Labs  11/14/13 0335 11/15/13 0425  WBC 11.7* 10.2  HGB 11.2* 11.4*  HCT 33.6* 35.4*  PLT 224 227   BMET:  Recent Labs  11/14/13 0335 11/15/13 0425  NA 140 141  K 3.3* 4.1  CL 104 106  CO2 27 28  GLUCOSE 131* 92  BUN 11 8  CREATININE 0.48* 0.52  CALCIUM 7.5* 7.9*    PT/INR: No results found for this basename: LABPROT, INR,  in the last 72 hours Radiology: Dg Chest Port 1 View  11/16/2013   CLINICAL DATA:  73 year old female with lung cancer. Status post left lower lobe superior segmentectomy.  EXAM: PORTABLE CHEST - 1 VIEW  COMPARISON:  11/15/2013 and prior studies.  FINDINGS: Two left thoracostomy tubes and a left IJ central venous catheter with tip overlying the mid SVC again noted.  A tiny (less than 5%) left apical pneumothorax is again noted.  Tiny bilateral pleural effusions and pulmonary vascular congestion noted.  Mild bibasilar atelectasis is again identified, improved on the left.  IMPRESSION: Improved left basilar atelectasis, otherwise no significant change. Pulmonary vascular congestion and tiny left apical pneumothorax again noted.  Electronically Signed   By: Hassan Rowan M.D.   On: 11/16/2013 09:27   Dg Chest Port 1 View  11/15/2013   CLINICAL DATA:  Post segmentectomy  EXAM: PORTABLE CHEST - 1 VIEW  COMPARISON:  11/14/2013; 11/13/2013; 05/11/2013; chest CT -09/09/2013  FINDINGS: Grossly unchanged cardiac silhouette and mediastinal contours with atherosclerotic plaque within the thoracic aorta. Stable postsurgical change of the left hilum. Stable positioning of support apparatus. No pneumothorax. Pulmonary vasculature appears slightly less distinct on present examination with cephalization of flow. Interval development trace bilateral effusions with associated worsening bibasilar  opacities, left greater than right. Unchanged bones.  IMPRESSION: 1.  Stable positioning of support apparatus.  No pneumothorax. 2. Suspected mild edema with development trace effusions and worsening bibasilar opacities, left greater than right, likely atelectasis.   Electronically Signed   By: Sandi Mariscal M.D.   On: 11/15/2013 07:37   Dg Chest Port 1v Same Day  11/15/2013   CLINICAL DATA:  Lung cancer.  Lung surgery.  EXAM: PORTABLE CHEST - 1 VIEW SAME DAY  COMPARISON:  11/15/2013.  FINDINGS: Left IJ line and left chest tubes in stable position. Mediastinum hilar structures are unremarkable. Postsurgical changes left upper lobe. Basilar atelectasis. Very subtle left apical pneumothorax cannot be excluded. No acute bony abnormality.  IMPRESSION: 1. Line tubes in stable position. 2. Tiny left apical pneumothorax cannot be completely excluded. Critical Value/emergent results were called by telephone at the time of interpretation on 11/15/2013 at 12:20 pm to nurse Linus Orn , who verbally acknowledged these results. 3.  Postsurgical changes left upper lobe.  Bibasilar atelectasis.   Electronically Signed   By: Marcello Moores  Register   On: 11/15/2013 12:22     Chest tube- no air leak noted, 460 cc recorded yesterday Assessment/Plan: S/P Procedure(s) (LRB): VIDEO ASSISTED THORACOSCOPY, Left lower lobe superior segmentectomy, cryoanalgesia of intercostal nerves, node dissection (Left)  1 keep CT's in place, poss d/c one tube soon 2 no new labs 3 has PIV, will d/c central line 4 may need short term placement- has nobody at home except 3 dogs 5 wean O2, and push rehab as able   GOLD,WAYNE E 11/16/2013 9:36 AM  Still small air leak, 477ml from chest tubes will leave one more day I have seen and examined [name] and agree with the above assessment  and plan.  Grace Isaac MD Beeper [phone] Office [phone] 11/16/2013 12:35 PM

## 2013-11-17 ENCOUNTER — Inpatient Hospital Stay (HOSPITAL_COMMUNITY): Payer: Medicare Other

## 2013-11-17 LAB — CBC
HEMATOCRIT: 37.3 % (ref 36.0–46.0)
HEMOGLOBIN: 12.3 g/dL (ref 12.0–15.0)
MCH: 31.9 pg (ref 26.0–34.0)
MCHC: 33 g/dL (ref 30.0–36.0)
MCV: 96.6 fL (ref 78.0–100.0)
Platelets: 237 10*3/uL (ref 150–400)
RBC: 3.86 MIL/uL — ABNORMAL LOW (ref 3.87–5.11)
RDW: 13.7 % (ref 11.5–15.5)
WBC: 7.1 10*3/uL (ref 4.0–10.5)

## 2013-11-17 LAB — BASIC METABOLIC PANEL
Anion gap: 10 (ref 5–15)
BUN: 11 mg/dL (ref 6–23)
CHLORIDE: 99 meq/L (ref 96–112)
CO2: 28 mEq/L (ref 19–32)
CREATININE: 0.51 mg/dL (ref 0.50–1.10)
Calcium: 8.5 mg/dL (ref 8.4–10.5)
GFR calc Af Amer: >90 mL/min (ref >90)
GFR calc non Af Amer: >90 mL/min (ref >90)
Glucose, Bld: 80 mg/dL (ref 70–99)
Potassium: 3.9 mEq/L (ref 3.7–5.3)
Sodium: 137 mEq/L (ref 137–147)

## 2013-11-17 NOTE — Progress Notes (Signed)
Wasted 37 mL (328mcg) fentanyl. Witnessed by Burman Nieves RN at [employer]

## 2013-11-17 NOTE — Progress Notes (Addendum)
TCTS DAILY ICU PROGRESS NOTE                   [address]            [place] [address]          [phone]   4 Days Post-Op Procedure(s) (LRB): VIDEO ASSISTED THORACOSCOPY, Left lower lobe superior segmentectomy, cryoanalgesia of intercostal nerves, node dissection (Left)  Total Length of Stay:  LOS: 4 days   Subjective: Feels a bit more discomfort today  Objective: Vital signs in last 24 hours: Temp:  [97.4 F (36.3 C)-98.3 F (36.8 C)] 97.9 F (36.6 C) (09/13 0439) Pulse Rate:  [68-78] 78 (09/13 0914) Cardiac Rhythm:  [-] Normal sinus rhythm (09/13 0439) Resp:  [13-22] 20 (09/13 0914) BP: (94-156)/(52-74) 124/65 mmHg (09/13 0914) SpO2:  [91 %-100 %] 92 % (09/13 0914)  Filed Weights   11/14/13 0600 11/14/13 0853  Weight: 109 lb 12.6 oz (49.8 kg) 110 lb 3.7 oz (50 kg)    Weight change:    Hemodynamic parameters for last 24 hours:    Intake/Output from previous day: 09/12 0701 - 09/13 0700 In: 470 [P.O.:240; I.V.:230] Out: 1375 [Urine:1175; Chest Tube:200]  Intake/Output this shift:    Current Meds: Scheduled Meds: . acetaminophen  1,000 mg Oral 4 times per day   Or  . acetaminophen (TYLENOL) oral liquid 160 mg/5 mL  1,000 mg Oral 4 times per day  . antiseptic oral rinse  7 mL Mouth Rinse BID  . aspirin EC  81 mg Oral Daily  . bisacodyl  10 mg Oral Daily  . enoxaparin (LOVENOX) injection  40 mg Subcutaneous Q24H  . fentaNYL   Intravenous 6 times per day  . mometasone-formoterol  2 puff Inhalation BID  . senna-docusate  1 tablet Oral QHS  . sodium chloride  10-40 mL Intracatheter Q12H   Continuous Infusions: . dextrose 5 % and 0.9 % NaCl with KCl 20 mEq/L 10 mL/hr at 11/15/13 0847   PRN Meds:.albuterol, ALPRAZolam, Influenza vac split quadrivalent PF, naloxone, ondansetron (ZOFRAN) IV, potassium chloride, sodium chloride, sodium chloride, traMADol, white petrolatum  General appearance: alert, cooperative and no distress Heart:  regular rate and rhythm Lungs: somewhat more congested this am Abdomen: benign Extremities: no edema Wound: incis healing well  Lab Results: CBC: Recent Labs  11/15/13 0425 11/17/13 0445  WBC 10.2 7.1  HGB 11.4* 12.3  HCT 35.4* 37.3  PLT 227 237   BMET:  Recent Labs  11/15/13 0425 11/17/13 0445  NA 141 137  K 4.1 3.9  CL 106 99  CO2 28 28  GLUCOSE 92 80  BUN 8 11  CREATININE 0.52 0.51  CALCIUM 7.9* 8.5    PT/INR: No results found for this basename: LABPROT, INR,  in the last 72 hours Radiology: Dg Chest Port 1 View  11/16/2013   CLINICAL DATA:  73 year old female with lung cancer. Status post left lower lobe superior segmentectomy.  EXAM: PORTABLE CHEST - 1 VIEW  COMPARISON:  11/15/2013 and prior studies.  FINDINGS: Two left thoracostomy tubes and a left IJ central venous catheter with tip overlying the mid SVC again noted.  A tiny (less than 5%) left apical pneumothorax is again noted.  Tiny bilateral pleural effusions and pulmonary vascular congestion noted.  Mild bibasilar atelectasis is again identified, improved on the left.  IMPRESSION: Improved left basilar atelectasis, otherwise no significant change. Pulmonary vascular congestion and tiny left apical pneumothorax again noted.   Electronically  Signed   By: Hassan Rowan M.D.   On: 11/16/2013 09:27   Dg Chest Port 1v Same Day  11/15/2013   CLINICAL DATA:  Lung cancer.  Lung surgery.  EXAM: PORTABLE CHEST - 1 VIEW SAME DAY  COMPARISON:  11/15/2013.  FINDINGS: Left IJ line and left chest tubes in stable position. Mediastinum hilar structures are unremarkable. Postsurgical changes left upper lobe. Basilar atelectasis. Very subtle left apical pneumothorax cannot be excluded. No acute bony abnormality.  IMPRESSION: 1. Line tubes in stable position. 2. Tiny left apical pneumothorax cannot be completely excluded. Critical Value/emergent results were called by telephone at the time of interpretation on 11/15/2013 at 12:20 pm to nurse  Linus Orn , who verbally acknowledged these results. 3.  Postsurgical changes left upper lobe.  Bibasilar atelectasis.   Electronically Signed   By: Marcello Moores  Register   On: 11/15/2013 12:22    Chest tube- small air leak with cough, only 50 cc drainage  Assessment/Plan: S/P Procedure(s) (LRB): VIDEO ASSISTED THORACOSCOPY, Left lower lobe superior segmentectomy, cryoanalgesia of intercostal nerves, node dissection (Left)  1 cont to push rehab/pulm toilet 2 will remove blake drain and leave chest tube 3 labs stable 4 SW is assisting with placement    GOLD,WAYNE E 11/17/2013 9:14 AM  D/c posterior chest tube today , likely d/c second tube tomorrow I have seen and examined [name] and agree with the above assessment  and plan.  Grace Isaac MD Beeper [phone] Office [phone] 11/17/2013 10:48 AM

## 2013-11-18 ENCOUNTER — Inpatient Hospital Stay (HOSPITAL_COMMUNITY): Payer: Medicare Other

## 2013-11-18 NOTE — Progress Notes (Signed)
Chest tube removed per MD order. Pt tolerated procedure well. Will continue to monitor.

## 2013-11-18 NOTE — Progress Notes (Addendum)
       [address]       [place] [address]             [phone]          5 Days Post-Op Procedure(s) (LRB): VIDEO ASSISTED THORACOSCOPY, Left lower lobe superior segmentectomy, cryoanalgesia of intercostal nerves, node dissection (Left)  Subjective: "I felt like hell all day yesterday, but I'm better today."  Had a lot of pain, but improved this am.  Walked in hall already, breathing stable.   Objective: Vital signs in last 24 hours: Patient Vitals for the past 24 hrs:  BP Temp Temp src Pulse Resp SpO2  11/18/13 0410 157/76 mmHg 97.3 F (36.3 C) Oral 101 18 92 %  11/18/13 0006 139/69 mmHg 97.5 F (36.4 C) Oral 78 17 90 %  11/17/13 2042 142/62 mmHg - - 88 18 92 %  11/17/13 1952 142/62 mmHg 97.4 F (36.3 C) Oral 84 22 90 %  11/17/13 1421 - 98.1 F (36.7 C) Oral - - -  11/17/13 1405 121/71 mmHg - - 71 - 90 %  11/17/13 0914 124/65 mmHg - - 78 20 92 %  11/17/13 0831 - - - - 17 98 %  11/17/13 0823 - - - - - 98 %   Current Weight  11/14/13 110 lb 3.7 oz (50 kg)     Intake/Output from previous day: 09/13 0701 - 09/14 0700 In: 120 [P.O.:120] Out: 1825 [Urine:1575; Chest Tube:250]    PHYSICAL EXAM:  Heart: RRR Lungs: Clear, slightly decreased BS on R Wound: Dressed and dry Chest tube: No air leak    Lab Results: CBC: Recent Labs  11/17/13 0445  WBC 7.1  HGB 12.3  HCT 37.3  PLT 237   BMET:  Recent Labs  11/17/13 0445  NA 137  K 3.9  CL 99  CO2 28  GLUCOSE 80  BUN 11  CREATININE 0.51  CALCIUM 8.5    PT/INR: No results found for this basename: LABPROT, INR,  in the last 72 hours  CXR: stable, ?tiny apical ptx  Path: Invasive squamous cell ca (T1a, N0)  Assessment/Plan: S/P Procedure(s) (LRB): VIDEO ASSISTED THORACOSCOPY, Left lower lobe superior segmentectomy, cryoanalgesia of intercostal nerves, node dissection (Left) CXR stable, no air leak with cough.  CT output documented 250 ml/first shift yesterday, but none  since.  Scant serous fluid in chamber. Hopefully can d/c remaining CT today. Continue ambulation, pulm toilet. Disp- to SNF once medically stable.   LOS: 5 days    COLLINS,GINA H 11/18/2013  Patient seen and examined, agree with above No air leak- dc CT To SNF tomorrow- she is requesting [facility]

## 2013-11-18 NOTE — Progress Notes (Signed)
Reviewed and agree with POC, Patient is progressing well with mobility. Patient remains reluctant to ask for assist from friends/family for return home initially. If patient we able to arrange initial support feel patient would be safe for d/c home, however, at this time initial support unavailable, thus patient still to benefit from [facility].  Alben Deeds, [place] DPT  [phone]

## 2013-11-18 NOTE — Clinical Social Work Note (Signed)
CSW informed by [facility] that they can not offer the pt a bed. Pt would update. "I guess we can go with Beverly Francis." CSW contacted by [facility] Admission Coordinator inquiring about chemo/radition treatment or follow-ups after she is discharge from the hospital. CSW left Dr. Blase Mess a message.   [place], MSW, [place]

## 2013-11-18 NOTE — Progress Notes (Signed)
Physical Therapy Treatment Patient Details Name: Beverly Francis MRN: [id number] DOB: April 25, 1940 Today's Date: 11/18/2013    History of Present Illness Patient is a 73 yo female s/p Left lower lobe superior segmentectomy, cryoanalgesia of intercostal nerves, node dissection (Left).    PT Comments    Pt is progressing towards goals and demonstrating improvements in aerobic capacity. Pt requires A for lines/leads during all mobility tasks, cueing to reduce impulsiveness, and min guard with RW for ambulation. Pt to continue to receive skilled PT as indicated and will progress as tolerated to continue progress towards goals.  Follow Up Recommendations  SNF;Supervision/Assistance - 24 hour     Equipment Recommendations  Other (comment) (TBD)    Recommendations for Other Services       Precautions / Restrictions Precautions Precaution Comments: Chest tube Restrictions Weight Bearing Restrictions: No    Mobility  Bed Mobility Overal bed mobility: Needs Assistance Bed Mobility: Sit to Supine;Supine to Sit     Supine to sit: Supervision (A required for lines, leads and tubes managment) Sit to supine: Supervision (A required for lines, leads and tubes management)      Transfers Overall transfer level: Needs assistance Equipment used: Rolling walker (2 wheeled) Transfers: Sit to/from [facility] Sit to Stand: Supervision Stand pivot transfers: Supervision (transfer to/from [facility], without use of AD )       General transfer comment: A required for lines/leads/tubes management during transfers  Ambulation/Gait Ambulation/Gait assistance: Min guard Ambulation Distance (Feet): 340 Feet Assistive device: Rolling walker (2 wheeled) Gait Pattern/deviations: Step-through pattern   Gait velocity interpretation: Below normal speed for age/gender General Gait Details: pt able to amb with RW, no rest breaks required. Pt able to walk and talk without demonstrating sx  of SOB or experiencing decrease in SaO2.    Stairs            Wheelchair Mobility    Modified Rankin (Stroke Patients Only)       Balance Overall balance assessment: Needs assistance Sitting-balance support: Feet supported Sitting balance-Leahy Scale: Good     Standing balance support: During functional activity;Bilateral upper extremity supported Standing balance-Leahy Scale: Fair                      Cognition Arousal/Alertness: Awake/alert Behavior During Therapy: Impulsive Overall Cognitive Status: No family/caregiver present to determine baseline cognitive functioning                      Exercises      General Comments General comments (skin integrity, edema, etc.): Pt appears to be eager to get up and move around, requiring frequent cueing to wait for therapist prior to moving. No report of experiencing SOB during treatment.       Pertinent Vitals/Pain Pain Assessment: 0-10 Pain Descriptors / Indicators: Sore    Home Living                      Prior Function            PT Goals (current goals can now be found in the care plan section) Acute Rehab PT Goals Patient Stated Goal: to be independent and go home PT Goal Formulation: With patient Time For Goal Achievement: 11/30/13 Potential to Achieve Goals: Good Progress towards PT goals: Progressing toward goals    Frequency  Min 3X/week    PT Plan Current plan remains appropriate    Co-evaluation  End of Session Equipment Utilized During Treatment: Gait belt Activity Tolerance: Patient tolerated treatment well Patient left: in bed;with call bell/phone within reach     Time: 4825-0037 PT Time Calculation (min): 17 min  Charges:                       G Codes:      Trayon Krantz 11/18/2013, 1:05 PM Levonne Hubert, SPT

## 2013-11-19 ENCOUNTER — Inpatient Hospital Stay (HOSPITAL_COMMUNITY): Payer: Medicare Other

## 2013-11-19 MED ORDER — TRAMADOL HCL 50 MG PO TABS: 50.0000 mg | ORAL_TABLET | Freq: Four times a day (QID) | ORAL | Status: DC | PRN

## 2013-11-19 NOTE — Clinical Social Work Note (Signed)
Pt will transition to [facility] awaiting insurance authorization and chest x-ray.     [place], MSW, [place]

## 2013-11-19 NOTE — Progress Notes (Addendum)
       [address]       ,[place] [address]             [phone]          6 Days Post-Op Procedure(s) (LRB): VIDEO ASSISTED THORACOSCOPY, Left lower lobe superior segmentectomy, cryoanalgesia of intercostal nerves, node dissection (Left)  Subjective: More comfortable now that CTs are out.  Still sore, but otherwise no complaints.   Objective: Vital signs in last 24 hours: Patient Vitals for the past 24 hrs:  BP Temp Temp src Pulse Resp SpO2  11/19/13 0305 149/77 mmHg 97.9 F (36.6 C) Oral 85 19 93 %  11/19/13 0036 131/73 mmHg 98.9 F (37.2 C) Oral 85 16 96 %  11/18/13 2147 - - - - - 94 %  11/18/13 2118 134/63 mmHg 97.8 F (36.6 C) Oral 78 16 92 %  11/18/13 1925 - - - 80 19 95 %  11/18/13 1817 - - - 86 - 94 %  11/18/13 1123 149/59 mmHg 98.4 F (36.9 C) Oral - - -  11/18/13 1110 - - - - - 95 %   Current Weight  11/14/13 110 lb 3.7 oz (50 kg)     Intake/Output from previous day: 09/14 0701 - 09/15 0700 In: 640 [P.O.:640] Out: 1150 [Urine:1150]    PHYSICAL EXAM:  Heart: RRR Lungs: Clear Wound: Clean and dry     Lab Results: CBC: Recent Labs  11/17/13 0445  WBC 7.1  HGB 12.3  HCT 37.3  PLT 237   BMET:  Recent Labs  11/17/13 0445  NA 137  K 3.9  CL 99  CO2 28  GLUCOSE 80  BUN 11  CREATININE 0.51  CALCIUM 8.5    PT/INR: No results found for this basename: LABPROT, INR,  in the last 72 hours    Assessment/Plan: S/P Procedure(s) (LRB): VIDEO ASSISTED THORACOSCOPY, Left lower lobe superior segmentectomy, cryoanalgesia of intercostal nerves, node dissection (Left) Pt stable and doing well.  CXR not done yet this am.   Will follow up CXR- hopefully ready for d/c to SNF later today if CXR ok.     LOS: 6 days    COLLINS,GINA H 11/19/2013  Patient seen and examined agree with above Still waiting on CXR Assuming CXR OK she is Ok to go to SNF when bed available

## 2013-11-22 ENCOUNTER — Non-Acute Institutional Stay (SKILLED_NURSING_FACILITY): Payer: Medicare Other | Admitting: Adult Health

## 2013-11-22 DIAGNOSIS — J438 Other emphysema: Secondary | ICD-10-CM

## 2013-11-22 DIAGNOSIS — J432 Centrilobular emphysema: Secondary | ICD-10-CM

## 2013-11-22 DIAGNOSIS — Z902 Acquired absence of lung [part of]: Secondary | ICD-10-CM

## 2013-11-22 DIAGNOSIS — C3492 Malignant neoplasm of unspecified part of left bronchus or lung: Secondary | ICD-10-CM

## 2013-11-22 DIAGNOSIS — C3490 Malignant neoplasm of unspecified part of unspecified bronchus or lung: Secondary | ICD-10-CM

## 2013-11-22 DIAGNOSIS — Z9889 Other specified postprocedural states: Secondary | ICD-10-CM

## 2013-11-25 ENCOUNTER — Other Ambulatory Visit: Payer: Self-pay | Admitting: Thoracic Surgery (Cardiothoracic Vascular Surgery)

## 2013-11-25 DIAGNOSIS — C3490 Malignant neoplasm of unspecified part of unspecified bronchus or lung: Secondary | ICD-10-CM

## 2013-11-26 ENCOUNTER — Ambulatory Visit
Admission: RE | Admit: 2013-11-26 | Discharge: 2013-11-26 | Disposition: A | Discharge status: Home / Self Care | Payer: Medicare Other | Source: Ambulatory Visit | Attending: Thoracic Surgery (Cardiothoracic Vascular Surgery) | Admitting: Thoracic Surgery (Cardiothoracic Vascular Surgery)

## 2013-11-26 ENCOUNTER — Ambulatory Visit (INDEPENDENT_AMBULATORY_CARE_PROVIDER_SITE_OTHER): Payer: Medicare Other | Admitting: Thoracic Surgery (Cardiothoracic Vascular Surgery)

## 2013-11-26 ENCOUNTER — Encounter: Payer: Self-pay | Admitting: Thoracic Surgery (Cardiothoracic Vascular Surgery)

## 2013-11-26 VITALS — BP 126/77 | HR 70 | Ht 64.0 in | Wt 104.0 lb

## 2013-11-26 DIAGNOSIS — C3490 Malignant neoplasm of unspecified part of unspecified bronchus or lung: Secondary | ICD-10-CM

## 2013-11-26 DIAGNOSIS — R911 Solitary pulmonary nodule: Secondary | ICD-10-CM

## 2013-11-26 NOTE — Progress Notes (Signed)
HPI:  Mrs. Ildefonso returns today for a scheduled postoperative followup visit.  She is a 73 year old woman who underwent a thoracoscopic left lower lobe superior segmentectomy on September 9. Her postoperative course was uncomplicated and she was discharged on September 15. She initially went to a nursing home but was discharged thereafter a week, she is now at home.  She says she's having more pain today. She has not take any pain medication in several days. She is not having any shortness of breath. She denies swelling in her legs.  Past Medical History  Diagnosis Date  . Bladder cancer   . Anxiety     takes Xanax daily as needed  . COPD (chronic obstructive pulmonary disease)     Advair daily and Ventolin as needed  . Hypertension     takes Lisinopril daily  . Lung mass     left  . Shortness of breath     with exertion  . Arthritis   . Osteoporosis   . History of colon polyps   . Urinary frequency   . Nocturia   . Cataracts, bilateral       Current Outpatient Prescriptions  Medication Sig Dispense Refill  . ADVAIR DISKUS 250-50 MCG/DOSE AEPB Inhale 1 puff into the lungs daily.       Marland Kitchen ALPRAZolam (XANAX) 0.25 MG tablet Take 1 tablet (0.25 mg total) by mouth at bedtime as needed for anxiety.  30 tablet  0  . aspirin EC 81 MG tablet Take 81 mg by mouth daily.      . Calcium 600-200 MG-UNIT per tablet Take 1 tablet by mouth daily.      Marland Kitchen EPIPEN 2-PAK 0.3 MG/0.3ML SOAJ injection 0.3 mg once. prn      . lisinopril (PRINIVIL,ZESTRIL) 10 MG tablet Take 10 mg by mouth daily.      . traMADol (ULTRAM) 50 MG tablet Take 1-2 tablets (50-100 mg total) by mouth every 6 (six) hours as needed for moderate pain (mild pain).  30 tablet  0  . VENTOLIN HFA 108 (90 BASE) MCG/ACT inhaler Inhale 1-2 puffs into the lungs every 6 (six) hours as needed.       . CHANTIX STARTING MONTH PAK 0.5 MG X 11 & 1 MG X 42 tablet        No current facility-administered medications for this visit.     Physical Exam BP 126/77  Pulse 70  Ht 5\' 4"  (1.626 m)  Wt 104 lb (47.174 kg)  BMI 17.84 kg/m2  SpO38 68% 73 year old woman in no acute distress Well-developed and well-nourished Alert and oriented x3 with no neurologic deficits Incision healing well Lungs slightly diminished at left base, otherwise clear Cardiac regular rate and rhythm normal S1 and S2  Diagnostic Tests: CHEST 2 VIEW  COMPARISON: November 19, 2013  FINDINGS:  The heart size and mediastinal contours are stable. Postsurgical  changes are identified at the left hilum. There is a small left  pleural effusion unchanged. The right lung is clear. There is a  minimal left apical pneumothorax unchanged. The visualized skeletal  structures are stable.  IMPRESSION:  Minimal left apical pneumothorax is unchanged. Small left pleural  effusion unchanged compared to November 19, 2013.  Electronically Signed  By: Abelardo Diesel M.D.  On: 11/26/2013 14:33   Impression: 73 year old woman who is now about 2 weeks post thoracoscopic left lower lobe superior segmentectomy. She is doing extremely well at this point in time. I divided to go ahead and fill her  prescription for Ultram she may need that from time to time.  She may drive except during the times when she is taking the narcotic. Appropriate precautions were discussed.  Her activities are otherwise unrestricted.  She has stage IA disease and does not need any adjuvant therapy. I did discuss with her the possibility of sitting oncology and getting into the cancer center to see what ancillary services are available. She is not interested in that at this time.  Plan: I'll plan to see her back in 5 months with CT of the chest for 6 month followup

## 2013-11-27 NOTE — Progress Notes (Signed)
Patient ID: Beverly Francis, female   DOB: 1940/09/05, 73 y.o.   MRN: [id number]     [facility]  Allergies  Allergen Reactions  . Bee Venom     Feeling hot, numbness  . Oxycodone Other (See Comments)    Facial numbness     Chief Complaint  Patient presents with  . 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 She had left lower lobe segmentectomy with successful removal of the cancer. She was admitted to this facility for short term rehab. She is ready for discharge to home iwht home health for pt/ot/rn. She will need her prescriptions to be written will not need dme. She will be following up with her surgeon post discharge.   Past Medical History  Diagnosis Date  . Bladder cancer   . Anxiety     takes Xanax daily as needed  . COPD (chronic obstructive pulmonary disease)     Advair daily and Ventolin as needed  . Hypertension     takes Lisinopril daily  . Lung mass     left  . Shortness of breath     with exertion  . Arthritis   . Osteoporosis   . History of colon polyps   . Urinary frequency   . Nocturia   . Cataracts, bilateral     Past Surgical History  Procedure Laterality Date  . Bladder surgery      1996 and 2012 Dr Jeffie Pollock  . Hand surgery Right 25+yrs ago  . Tonsillectomy  at age 26  . Colonoscopy    . Cystoscopy      multiple;urologist is Dr.Wrenn  . Video assisted thoracoscopy Left 11/13/2013    Procedure: VIDEO ASSISTED THORACOSCOPY, Left lower lobe superior segmentectomy, cryoanalgesia of intercostal nerves, node dissection;  Surgeon: Melrose Nakayama, MD;  Location: [facility] OR;  Service: Thoracic;  Laterality: Left;    VITAL SIGNS BP 117/64  Pulse 67  Ht 5\' 4"  (1.626 m)  Wt 104 lb (47.174 kg)  BMI 17.84 kg/m2   Patient's Medications  New Prescriptions   No medications on file  Previous Medications   ADVAIR DISKUS 250-50 MCG/DOSE AEPB    Inhale 1 puff into the lungs daily.    ALPRAZOLAM (XANAX) 0.25 MG TABLET    Take 1 tablet (0.25 mg total) by mouth at bedtime as needed for anxiety.   ASPIRIN EC 81 MG TABLET    Take 81 mg by mouth daily.   CALCIUM 600-200 MG-UNIT PER TABLET    Take 1 tablet by mouth daily.   CHANTIX STARTING MONTH PAK 0.5 MG X 11 & 1 MG X 42 TABLET       EPIPEN 2-PAK 0.3 MG/0.3ML SOAJ INJECTION    0.3 mg once. prn   LISINOPRIL (PRINIVIL,ZESTRIL) 10 MG TABLET    Take 10 mg by mouth daily.   TRAMADOL (ULTRAM) 50 MG TABLET    Take 1-2 tablets (50-100 mg total) by mouth every 6 (six) hours as needed for moderate pain (mild pain).   VENTOLIN HFA 108 (90 BASE) MCG/ACT INHALER    Inhale 1-2 puffs into the lungs every 6 (six) hours as needed.   Modified Medications   No medications on file  Discontinued Medications   No medications on file    SIGNIFICANT DIAGNOSTIC EXAMS  11-19-13: chest x-ray: Minimal left apical pneumothorax is noted which is improved compared to prior exam. Stable left basilar opacity is  noted concerning for atelectasis with associated pleural effusion.    LABS REVIEWED:   11-08-13: wbc 10.9; hgb 12.8; hct 39.4; mcv 95.3 ;plt 264; glucose 97; bun 17; creat 0.76; k+4.1; na++140 liver normal albumin 3.7 11-17-13: wbc 7.1; hgb 12.3; hct 37.3; mcv 96.6; plt 237; glucose 80; bun 11; creat 0.51; k+3.9; na++137    Review of Systems  Constitutional: Negative for weight loss.  Respiratory: Negative for cough, shortness of breath and wheezing.   Cardiovascular: Negative for chest pain, palpitations and leg swelling.  Gastrointestinal: Negative for heartburn, abdominal pain and constipation.  Musculoskeletal: Negative for joint pain and myalgias.  Skin: Negative.   Neurological: Negative for headaches.  Psychiatric/Behavioral: Negative for depression. The patient is not nervous/anxious.      Physical Exam  Constitutional: She is oriented to person, place, and time. She appears well-developed and well-nourished. No distress.  thin    Neck: Neck supple.  Cardiovascular: Normal rate, regular rhythm and intact distal pulses.   Respiratory: Effort normal and breath sounds normal. No respiratory distress.  GI: Soft. Bowel sounds are normal. She exhibits no distension. There is no tenderness.  Musculoskeletal: Normal range of motion. She exhibits no edema.  Neurological: She is alert and oriented to person, place, and time.  Skin: Skin is warm and dry. She is not diaphoretic.  Psychiatric: She has a normal mood and affect.      ASSESSMENT/ PLAN:  Will discharge to home for home health to pt/ot/rn to improve upon strength; independence with adl's; and disease management. She will not need dme. Her prescriptions have been written for a 30 day supply of her medications with ultram 50 mg #60 tabs and xanax 0.25 mg #30 tabs.    Time spent with patient 45 minutes.    Ok Edwards NP [facility]  Contact [phone] Monday through Friday 8am- 5pm  After hours call [phone]

## 2013-12-03 ENCOUNTER — Other Ambulatory Visit: Payer: Self-pay | Admitting: *Deleted

## 2013-12-03 ENCOUNTER — Other Ambulatory Visit: Payer: Self-pay | Admitting: Internal Medicine

## 2013-12-03 DIAGNOSIS — G8918 Other acute postprocedural pain: Secondary | ICD-10-CM

## 2013-12-03 DIAGNOSIS — R0789 Other chest pain: Secondary | ICD-10-CM

## 2013-12-03 DIAGNOSIS — R0602 Shortness of breath: Secondary | ICD-10-CM

## 2013-12-03 MED ORDER — HYDROMORPHONE HCL 2 MG PO TABS: 2.0000 mg | ORAL_TABLET | ORAL | Status: DC | PRN

## 2013-12-04 ENCOUNTER — Ambulatory Visit
Admission: RE | Admit: 2013-12-04 | Discharge: 2013-12-04 | Disposition: A | Discharge status: Home / Self Care | Payer: Medicare Other | Source: Ambulatory Visit | Attending: Internal Medicine | Admitting: Internal Medicine

## 2013-12-04 DIAGNOSIS — R0602 Shortness of breath: Secondary | ICD-10-CM

## 2013-12-04 DIAGNOSIS — R0789 Other chest pain: Secondary | ICD-10-CM

## 2013-12-04 MED ORDER — IOHEXOL 350 MG/ML SOLN
100.0000 mL | Freq: Once | INTRAVENOUS | Status: AC | PRN
  Administered 2013-12-04: 100 mL via INTRAVENOUS

## 2013-12-05 ENCOUNTER — Other Ambulatory Visit: Payer: Medicare Other

## 2013-12-11 ENCOUNTER — Telehealth: Payer: Self-pay | Admitting: *Deleted

## 2013-12-11 ENCOUNTER — Other Ambulatory Visit: Payer: Self-pay | Admitting: Urology

## 2013-12-11 DIAGNOSIS — G8918 Other acute postprocedural pain: Secondary | ICD-10-CM

## 2013-12-11 MED ORDER — TRAMADOL HCL 50 MG PO TABS: 50.0000 mg | ORAL_TABLET | Freq: Four times a day (QID) | ORAL | Status: DC | PRN

## 2013-12-11 NOTE — Telephone Encounter (Signed)
Mrs. Pleitez called with a request for a refill for Tramadol.  I said I would fax a new signed script to her pharmacy and she agreed.

## 2013-12-20 ENCOUNTER — Encounter (HOSPITAL_BASED_OUTPATIENT_CLINIC_OR_DEPARTMENT_OTHER): Payer: Self-pay | Admitting: *Deleted

## 2013-12-20 NOTE — Progress Notes (Signed)
Pt instructed npo p mn 10/19 x advair and proventil inhaler.  Pt to bring proventil w her.  To [facility] 10/20 @ 0900.  Needs istat on arrival

## 2013-12-23 ENCOUNTER — Other Ambulatory Visit: Payer: Self-pay

## 2013-12-23 DIAGNOSIS — G8918 Other acute postprocedural pain: Secondary | ICD-10-CM

## 2013-12-23 MED ORDER — TRAMADOL HCL 50 MG PO TABS: 50.0000 mg | ORAL_TABLET | Freq: Two times a day (BID) | ORAL | Status: DC | PRN

## 2013-12-23 NOTE — H&P (Signed)
Active Problems Problems  1. Chronic cystitis (N30.20) 2. Microscopic hematuria (R31.2) 3. Personal history of bladder cancer (Z85.51) 4. Urge incontinence of urine (N39.41)  History of Present Illness Beverly Francis returns today to discuss her cytology and CT. She has a history of bladder cancer but her recent cystoscopy showed some erythematous areas that I thought were probably treatment effect from BCG, but her cytology returned positive and a CT hematuria study showed some enhancement of the left bladder wall. She needs a biopsy. She had left lung surgery with a wedge resection on 11/13/13. 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  She did well for 2 weeks but is having some incisional pain now.   Past Medical History Problems  1. History of hypertension (Z86.79) 2. History of osteopenia (Z87.39) 3. 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 Personal history of bladder cancer (Z85.51) 5. Personal history of bladder cancer (Z85.51) 6. History of Pulmonary nodule, left (R91.1)  Surgical History Problems  1. History of Bladder Surgery 2. History of Cystoscopy Bladder Tumor Posterior Wall Bilaterally 3. History of Cystoscopy With Biopsy 4. History of Cystoscopy With Resection Of Tumor 5. History of Lung Surgery  Current Meds 1. Advair Diskus 100-50 MCG/DOSE Inhalation Aerosol Powder Breath Activated;  Therapy: [id number] to Recorded 2. Aspirin 81 MG Oral Tablet;  Therapy: (Recorded:07Apr2008) to Recorded 3. Calcium + D TABS;  Therapy: (Recorded:25May2011) to Recorded 4. HYDROmorphone HCl TABS;  Therapy: (Recorded:05Oct2015) to Recorded 5. Lisinopril-Hydrochlorothiazide 10-12.5 MG Oral Tablet;  Therapy: 25Apr2012 to Recorded 6. Proventil HFA 108 (90 Base) MCG/ACT Inhalation Aerosol Solution;  Therapy: [id number] to Recorded 7. Vitamin D TABS;  Therapy: (Recorded:21Jul2015) to Recorded  Allergies Medication  1. OxyCODONE HCl  TABS  Family History Problems  1. Family history of Family Health Status Number Of Children   1-son 1-daughter  Social History Problems  1. Alcohol Use   once monthly 2. Marital History - Divorced 3. 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 4. History of Tobacco Use  Past and social history reviewed.  She has quit smoking.   Review of Systems  Cardiovascular: chest pain.  Respiratory: no shortness of breath.    Vitals Vital Signs [Data Includes: Last 1 Day]  Recorded: 05Oct2015 03:28PM  Blood Pressure: 101 / 71 Temperature: 98.4 F Heart Rate: 108  Physical Exam Constitutional: Well nourished and well developed . No acute distress.  Pulmonary: No respiratory distress and normal respiratory rhythm and effort.  Cardiovascular: Heart rate and rhythm are normal . No peripheral edema.    Results/Data Urine [Data Includes: Last 1 Day]   05Oct2015  COLOR YELLOW   APPEARANCE CLEAR   SPECIFIC GRAVITY 1.025   pH 5.5   GLUCOSE NEG mg/dL  BILIRUBIN NEG   KETONE NEG mg/dL  BLOOD SMALL   PROTEIN NEG mg/dL  UROBILINOGEN 0.2 mg/dL  NITRITE NEG   LEUKOCYTE ESTERASE NEG   SQUAMOUS EPITHELIAL/HPF MODERATE   WBC 0-2 WBC/hpf  RBC 0-2 RBC/hpf  BACTERIA MODERATE   CRYSTALS NONE SEEN   CASTS NONE SEEN    The following images/tracing/specimen were independently visualized:  CT films and report reviewed.  The following clinical lab reports were reviewed:  UA reviewed and recent lung path reviewed. cytology reviewed. Selected Results  BUN & CREATININE 03Sep2015 01:42PM Irine Seal  SPECIMEN TYPE: BLOOD   Test Name Result Flag Reference  CREATININE 0.70 mg/dL  0.50-1.40  BUN 18 mg/dL  6-23  Est GFR, African  American >89 mL/min    PERFORMED AT:        [facility]                      [address]                      [place], [place] [address]  Est GFR, NonAfrican American 86 mL/min    THE ESTIMATED GFR IS A CALCULATION VALID FOR ADULTS (>=18 YEARS  OLD) THAT USES THE CKD-EPI ALGORITHM TO ADJUST FOR AGE AND SEX. IT IS   NOT TO BE USED FOR CHILDREN, PREGNANT WOMEN, HOSPITALIZED PATIENTS,    PATIENTS ON DIALYSIS, OR WITH RAPIDLY CHANGING KIDNEY FUNCTION. ACCORDING TO THE NKDEP, EGFR >89 IS NORMAL, 60-89 SHOWS MILD IMPAIRMENT, 30-59 SHOWS MODERATE IMPAIRMENT, 15-29 SHOWS SEVERE IMPAIRMENT AND <15 IS ESRD.   AU CT-HEMATURIA PROTOCOL 03Sep2015 12:00AM Irine Seal   Test Name Result Flag Reference  AU CT-HEMATURIA PROTOCOL (Report)    ** RADIOLOGY REPORT BY [facility] **   CLINICAL DATA: Remote history of bladder cancer with recurrence in 2000. Now with microhematuria.  EXAM: CT ABDOMEN AND PELVIS WITHOUT AND WITH CONTRAST  TECHNIQUE: Multidetector CT imaging of the abdomen and pelvis was performed following the standard protocol before and following the bolus administration of intravenous contrast.  CONTRAST: 125 mL Isovue 300 IV  COMPARISON: None.  FINDINGS: Lung bases: Visualized lung bases are clear.  Kidneys / Ureters / Bladder: 2.2 x 2.3 cm cyst in the lateral interpolar right kidney (series 6/image 30). No enhancing renal lesions.  Bilateral renal vascular calcifications (series 2/ images 23 and 26). No renal, ureteral, or bladder calculi.  Bladder is underdistended. Minimal enhancement along the left lateral bladder wall following contrast administration (series 3/ image 66), although without definite mass.  On delayed imaging, there are no filling defects in the bilateral opacified proximal collecting systems, ureters, or bladder.  Other: Suspected focal fat/altered perfusion along the falciform ligament (series 3/image 28).  Spleen, pancreas, and adrenal glands are within normal limits.  Gallbladder is unremarkable. No intrahepatic or extrahepatic ductal dilatation.  No evidence of bowel obstruction. Normal appendix. Moderate colonic stool burden.  Atherosclerotic calcifications of the  aortic arch and and branch vessels.  No abdominopelvic ascites.  No suspicious abdominopelvic lymphadenopathy.  Uterus and bilateral ovaries are unremarkable.  Bones / Musculoskeletal: Mild to moderate superior endplate compression fracture deformity at L3, age indeterminate but likely chronic, with minimal retropulsion.  IMPRESSION: 2.3 cm cyst in the lateral interpolar right kidney. No enhancing renal lesions.  No renal, ureteral, or bladder calculi.  Minimal enhancement along the left lateral bladder wall, although without definite mass. Consider cystoscopic correlation.  No filling defects in the bilateral opacified proximal collecting systems, ureters, or bladder.   Electronically Signed  By: Julian Hy M.D.  On: 11/07/2013 16:41   URINE CYTOLOGY 26Aug2015 11:44AM Irine Seal  SPECIMEN TYPE: URINE   Test Name Result Flag Reference  FINAL DIAGNOSIS:  A   - CELLS PRESENT SUSPICIOUS FOR MALIGNANCY. - ATYPICAL UROTHELIAL CELLS ARE PRESENT. DUE TO THE DIAGNOSIS RENDERED ON THIS PATIENT WE REQUEST THAT THE CYTOLOGY LABORATORY BE PROVIDED WITH ADDITIONAL FOLLOW UP ON THE PATIENT WHEN AVAILABLE.  SOURCE:     Urine: Not Otherwise Specified  60CC OF YELLOW URI RECEIVED IN FIXATIVE 1 SLIDE PREPARED [id number]  Relevant Clinical Info     PMH: PERSONAL HISTORY OF BLADDER CANCER  PATHOLOGIST:  REVIEWED BY S. SERDAR DEMIRCI, MD, (ELECTRONIC SIGNATURE ON FILE)  NUMBER OF SLIDES     1 Container Submitted  CYTOTECHNOLOGIST:     SES, BS CT(ASCP)   Assessment Assessed  1. Abnormal urine cytology (R82.8) 2. Personal history of bladder cancer (Z85.51)  She had a positive cytology with bladder wall lesions and enhancement on CT.  She had a non-small cell squamous lung cancer on her LUL segmental resection and she is not going to need adjuvant therapy.   Plan Abnormal urine cytology  1. Follow-up Schedule Surgery Office  Follow-up  Status: Hold For -  Appointment   Requested for: 05Oct2015 Health Maintenance  2. UA With REFLEX; [Do Not Release]; Status:Resulted - Requires Verification;   Done:  [id number] 03:16PM  I am going to get her set up for cystoscopy with bladder biopsy and fulguration and reviewed the risks of bleeding, infection, bladder wall injury possibly requiring repair, thrombotic events and anesthetic complications.   Discussion/Summary CC: Dr. Maudry Mayhew and Dr. Merilynn Finland.

## 2013-12-23 NOTE — Telephone Encounter (Signed)
RX faxed to [facility] on [address]- Tramadol

## 2013-12-24 ENCOUNTER — Ambulatory Visit (HOSPITAL_BASED_OUTPATIENT_CLINIC_OR_DEPARTMENT_OTHER)
Admission: RE | Admit: 2013-12-24 | Discharge: 2013-12-24 | Disposition: A | Discharge status: Home / Self Care | Payer: Medicare Other | Source: Ambulatory Visit | Attending: Urology | Admitting: Urology

## 2013-12-24 ENCOUNTER — Encounter (HOSPITAL_BASED_OUTPATIENT_CLINIC_OR_DEPARTMENT_OTHER): Payer: Medicare Other | Admitting: Anesthesiology

## 2013-12-24 ENCOUNTER — Encounter (HOSPITAL_BASED_OUTPATIENT_CLINIC_OR_DEPARTMENT_OTHER): Payer: Self-pay

## 2013-12-24 ENCOUNTER — Encounter (HOSPITAL_BASED_OUTPATIENT_CLINIC_OR_DEPARTMENT_OTHER)
Admission: RE | Disposition: A | Discharge status: Home / Self Care | Payer: Self-pay | Source: Ambulatory Visit | Attending: Urology

## 2013-12-24 ENCOUNTER — Ambulatory Visit (HOSPITAL_BASED_OUTPATIENT_CLINIC_OR_DEPARTMENT_OTHER): Payer: Medicare Other | Admitting: Anesthesiology

## 2013-12-24 DIAGNOSIS — C671 Malignant neoplasm of dome of bladder: Secondary | ICD-10-CM | POA: Diagnosis not present

## 2013-12-24 DIAGNOSIS — Z7982 Long term (current) use of aspirin: Secondary | ICD-10-CM | POA: Insufficient documentation

## 2013-12-24 DIAGNOSIS — Z720 Tobacco use: Secondary | ICD-10-CM | POA: Diagnosis not present

## 2013-12-24 DIAGNOSIS — M8580 Other specified disorders of bone density and structure, unspecified site: Secondary | ICD-10-CM | POA: Diagnosis not present

## 2013-12-24 DIAGNOSIS — Z85118 Personal history of other malignant neoplasm of bronchus and lung: Secondary | ICD-10-CM | POA: Diagnosis not present

## 2013-12-24 DIAGNOSIS — N3020 Other chronic cystitis without hematuria: Secondary | ICD-10-CM | POA: Diagnosis not present

## 2013-12-24 DIAGNOSIS — I10 Essential (primary) hypertension: Secondary | ICD-10-CM | POA: Insufficient documentation

## 2013-12-24 DIAGNOSIS — Z79899 Other long term (current) drug therapy: Secondary | ICD-10-CM | POA: Diagnosis not present

## 2013-12-24 HISTORY — PX: CYSTOSCOPY WITH BIOPSY: SHX5122

## 2013-12-24 LAB — POCT I-STAT 4, (NA,K, GLUC, HGB,HCT)
Glucose, Bld: 92 mg/dL (ref 70–99)
HCT: 43 % (ref 36.0–46.0)
HEMOGLOBIN: 14.6 g/dL (ref 12.0–15.0)
Potassium: 4.4 mEq/L (ref 3.7–5.3)
Sodium: 139 mEq/L (ref 137–147)

## 2013-12-24 SURGERY — CYSTOSCOPY, WITH BIOPSY
Anesthesia: General | Site: Bladder

## 2013-12-24 MED ORDER — FENTANYL CITRATE 0.05 MG/ML IJ SOLN
INTRAMUSCULAR | Status: AC
  Filled 2013-12-24: qty 4

## 2013-12-24 MED ORDER — PROPOFOL 10 MG/ML IV BOLUS
INTRAVENOUS | Status: DC | PRN
  Administered 2013-12-24: 150 mg via INTRAVENOUS

## 2013-12-24 MED ORDER — DEXAMETHASONE SODIUM PHOSPHATE 4 MG/ML IJ SOLN
INTRAMUSCULAR | Status: DC | PRN
  Administered 2013-12-24: 4 mg via INTRAVENOUS

## 2013-12-24 MED ORDER — FENTANYL CITRATE 0.05 MG/ML IJ SOLN
INTRAMUSCULAR | Status: DC | PRN
  Administered 2013-12-24: 50 ug via INTRAVENOUS

## 2013-12-24 MED ORDER — ACETAMINOPHEN 650 MG RE SUPP
650.0000 mg | RECTAL | Status: DC | PRN
Start: 2013-12-24 — End: 2013-12-24
  Filled 2013-12-24: qty 1

## 2013-12-24 MED ORDER — SODIUM CHLORIDE 0.9 % IJ SOLN
3.0000 mL | Freq: Two times a day (BID) | INTRAMUSCULAR | Status: DC
  Filled 2013-12-24: qty 3

## 2013-12-24 MED ORDER — LIDOCAINE HCL (CARDIAC) 20 MG/ML IV SOLN
INTRAVENOUS | Status: DC | PRN
  Administered 2013-12-24: 70 mg via INTRAVENOUS

## 2013-12-24 MED ORDER — FENTANYL CITRATE 0.05 MG/ML IJ SOLN
25.0000 ug | INTRAMUSCULAR | Status: DC | PRN
  Filled 2013-12-24: qty 1

## 2013-12-24 MED ORDER — BELLADONNA ALKALOIDS-OPIUM 16.2-60 MG RE SUPP
RECTAL | Status: AC
  Filled 2013-12-24: qty 1

## 2013-12-24 MED ORDER — CIPROFLOXACIN IN D5W 400 MG/200ML IV SOLN
INTRAVENOUS | Status: AC
  Filled 2013-12-24: qty 200

## 2013-12-24 MED ORDER — CIPROFLOXACIN IN D5W 400 MG/200ML IV SOLN
400.0000 mg | INTRAVENOUS | Status: AC
  Administered 2013-12-24: 400 mg via INTRAVENOUS
  Filled 2013-12-24: qty 200

## 2013-12-24 MED ORDER — PROMETHAZINE HCL 25 MG/ML IJ SOLN
6.2500 mg | INTRAMUSCULAR | Status: DC | PRN
  Filled 2013-12-24: qty 1

## 2013-12-24 MED ORDER — ONDANSETRON HCL 4 MG/2ML IJ SOLN
INTRAMUSCULAR | Status: DC | PRN
  Administered 2013-12-24: 4 mg via INTRAVENOUS

## 2013-12-24 MED ORDER — STERILE WATER FOR IRRIGATION IR SOLN
Status: DC | PRN
  Administered 2013-12-24: 3000 mL

## 2013-12-24 MED ORDER — SODIUM CHLORIDE 0.9 % IV SOLN
250.0000 mL | INTRAVENOUS | Status: DC | PRN
  Filled 2013-12-24: qty 250

## 2013-12-24 MED ORDER — LACTATED RINGERS IV SOLN
INTRAVENOUS | Status: DC
  Administered 2013-12-24: 10:00:00 via INTRAVENOUS
  Filled 2013-12-24: qty 1000

## 2013-12-24 MED ORDER — KETOROLAC TROMETHAMINE 30 MG/ML IJ SOLN
15.0000 mg | Freq: Once | INTRAMUSCULAR | Status: DC | PRN
  Filled 2013-12-24: qty 1

## 2013-12-24 MED ORDER — ACETAMINOPHEN 325 MG PO TABS
650.0000 mg | ORAL_TABLET | ORAL | Status: DC | PRN
  Filled 2013-12-24: qty 2

## 2013-12-24 MED ORDER — PHENAZOPYRIDINE HCL 200 MG PO TABS: 200.0000 mg | ORAL_TABLET | Freq: Three times a day (TID) | ORAL | Status: DC | PRN

## 2013-12-24 MED ORDER — SODIUM CHLORIDE 0.9 % IJ SOLN
3.0000 mL | INTRAMUSCULAR | Status: DC | PRN
  Filled 2013-12-24: qty 3

## 2013-12-24 MED ORDER — ALBUTEROL SULFATE HFA 108 (90 BASE) MCG/ACT IN AERS
INHALATION_SPRAY | RESPIRATORY_TRACT | Status: DC | PRN
  Administered 2013-12-24: 2 via RESPIRATORY_TRACT

## 2013-12-24 SURGICAL SUPPLY — 24 items
BAG DRAIN URO-CYSTO SKYTR STRL (DRAIN) ×2 IMPLANT
BAG DRN UROCATH (DRAIN) ×1
BAG URINE DRAINAGE (UROLOGICAL SUPPLIES) ×1 IMPLANT
CANISTER SUCT LVC 12 LTR MEDI- (MISCELLANEOUS) ×1 IMPLANT
CATH FOLEY 2WAY SLVR  5CC 14FR (CATHETERS) ×1
CATH FOLEY 2WAY SLVR  5CC 16FR (CATHETERS)
CATH FOLEY 2WAY SLVR 5CC 14FR (CATHETERS) IMPLANT
CATH FOLEY 2WAY SLVR 5CC 16FR (CATHETERS) IMPLANT
CLOTH BEACON ORANGE TIMEOUT ST (SAFETY) ×2 IMPLANT
DRAPE CAMERA CLOSED 9X96 (DRAPES) ×2 IMPLANT
ELECT REM PT RETURN 9FT ADLT (ELECTROSURGICAL) ×2
ELECTRODE REM PT RTRN 9FT ADLT (ELECTROSURGICAL) ×1 IMPLANT
GLOVE SURG SS PI 7.5 STRL IVOR (GLOVE) ×2 IMPLANT
GLOVE SURG SS PI 8.0 STRL IVOR (GLOVE) ×2 IMPLANT
GOWN PREVENTION PLUS LG XLONG (DISPOSABLE) ×1 IMPLANT
GOWN STRL REIN XL XLG (GOWN DISPOSABLE) ×1 IMPLANT
GOWN STRL REUS W/TWL XL LVL3 (GOWN DISPOSABLE) ×2 IMPLANT
NDL SAFETY ECLIPSE 18X1.5 (NEEDLE) IMPLANT
NEEDLE HYPO 18GX1.5 SHARP (NEEDLE)
NEEDLE HYPO 22GX1.5 SAFETY (NEEDLE) IMPLANT
NS IRRIG 500ML POUR BTL (IV SOLUTION) IMPLANT
PACK CYSTO (CUSTOM PROCEDURE TRAY) ×2 IMPLANT
SYR 20CC LL (SYRINGE) IMPLANT
WATER STERILE IRR 3000ML UROMA (IV SOLUTION) ×2 IMPLANT

## 2013-12-24 NOTE — Transfer of Care (Signed)
Immediate Anesthesia Transfer of Care Note  Patient: Beverly Francis  Procedure(s) Performed: Procedure(s): CYSTOSCOPY WITH BLADDER BIOPSY AND FULGERATION  (N/A)  Patient Location: PACU  Anesthesia Type:General  Level of Consciousness: awake and oriented  Airway & Oxygen Therapy: Patient Spontanous Breathing and Patient connected to nasal cannula oxygen  Post-op Assessment: Report given to PACU RN  Post vital signs: Reviewed and stable  Complications: No apparent anesthesia complications

## 2013-12-24 NOTE — Discharge Instructions (Signed)
Cystoscopy, Care After Refer to this sheet in the next few weeks. These instructions provide you with information on caring for yourself after your procedure. Your caregiver may also give you more specific instructions. Your treatment has been planned according to current medical practices, but problems sometimes occur. Call your caregiver if you have any problems or questions after your procedure. HOME CARE INSTRUCTIONS  Things you can do to ease any discomfort after your procedure include:  Drinking enough water and fluids to keep your urine clear or pale yellow.  Taking a warm bath to relieve any burning feelings. SEEK IMMEDIATE MEDICAL CARE IF:   You have an increase in blood in your urine.  You notice blood clots in your urine.  You have difficulty passing urine.  You have the chills.  You have abdominal pain.  You have a fever or persistent symptoms for more than 2-3 days.  You have a fever and your symptoms suddenly get worse. MAKE SURE YOU:   Understand these instructions.  Will watch your condition.  Will get help right away if you are not doing well or get worse. Document Released: 09/10/2004 Document Revised: 10/24/2012 Document Reviewed: 08/15/2011 [facility] Patient Information 2015 [place], [place]. This information is not intended to replace advice given to you by your health care provider. Make sure you discuss any questions you have with your health care provider.    Post Anesthesia Home Care Instructions  Activity: Get plenty of rest for the remainder of the day. A responsible adult should stay with you for 24 hours following the procedure.  For the next 24 hours, DO NOT: -Drive a car -Paediatric nurse -Drink alcoholic beverages -Take any medication unless instructed by your physician -Make any legal decisions or sign important papers.  Meals: Start with liquid foods such as gelatin or soup. Progress to regular foods as tolerated. Avoid greasy, spicy,  heavy foods. If nausea and/or vomiting occur, drink only clear liquids until the nausea and/or vomiting subsides. Call your physician if vomiting continues.  Special Instructions/Symptoms: Your throat may feel dry or sore from the anesthesia or the breathing tube placed in your throat during surgery. If this causes discomfort, gargle with warm salt water. The discomfort should disappear within 24 hours.

## 2013-12-24 NOTE — Anesthesia Postprocedure Evaluation (Signed)
  Anesthesia Post-op Note  Patient: Beverly Francis  Procedure(s) Performed: Procedure(s) (LRB): CYSTOSCOPY WITH BLADDER BIOPSY AND FULGERATION  (N/A)  Patient Location: PACU  Anesthesia Type: General  Level of Consciousness: awake and alert   Airway and Oxygen Therapy: Patient Spontanous Breathing  Post-op Pain: mild  Post-op Assessment: Post-op Vital signs reviewed, Patient's Cardiovascular Status Stable, Respiratory Function Stable, Patent Airway and No signs of Nausea or vomiting  Last Vitals:  Filed Vitals:   12/24/13 1204  BP: 109/54  Pulse: 72  Temp: 36.8 C  Resp: 14    Post-op Vital Signs: stable   Complications: No apparent anesthesia complications

## 2013-12-24 NOTE — Brief Op Note (Signed)
12/24/2013  10:24 AM  PATIENT:  [name]  73 y.o. female  PRE-OPERATIVE DIAGNOSIS:  positive cytology with hx of bladder cancer   POST-OPERATIVE DIAGNOSIS:  positive cytology with hx of bladder cancer   PROCEDURE:  Procedure(s): CYSTOSCOPY WITH BLADDER BIOPSY AND FULGERATION  (N/A) 0.5-2cm lesion  SURGEON:  Surgeon(s) and Role:    * Malka So, MD - Primary  PHYSICIAN ASSISTANT:   ASSISTANTS: none   ANESTHESIA:   general  EBL:  Total I/O In: 100 [I.V.:100] Out: -   BLOOD ADMINISTERED:none  DRAINS: Urinary Catheter (Foley)   LOCAL MEDICATIONS USED:  NONE  SPECIMEN:  Source of Specimen:  2 biopsies from right dome and 1 from left lateral wall  DISPOSITION OF SPECIMEN:  PATHOLOGY  COUNTS:  YES  TOURNIQUET:  * No tourniquets in log *  DICTATION: .Other Dictation: Dictation Number [id number]  PLAN OF CARE: Discharge to home after PACU  PATIENT DISPOSITION:  PACU - hemodynamically stable.   Delay start of Pharmacological VTE agent (>24hrs) due to surgical blood loss or risk of bleeding: not applicable

## 2013-12-24 NOTE — Anesthesia Procedure Notes (Signed)
Procedure Name: LMA Insertion Date/Time: 12/24/2013 10:04 AM Performed by: Bethena Roys T Pre-anesthesia Checklist: Patient identified, Emergency Drugs available, Suction available and Patient being monitored Patient Re-evaluated:Patient Re-evaluated prior to inductionOxygen Delivery Method: Circle System Utilized Preoxygenation: Pre-oxygenation with 100% oxygen Intubation Type: IV induction Ventilation: Mask ventilation without difficulty LMA: LMA inserted LMA Size: 3.0 Number of attempts: 1 Airway Equipment and Method: bite block Placement Confirmation: positive ETCO2 Dental Injury: Teeth and Oropharynx as per pre-operative assessment

## 2013-12-24 NOTE — Anesthesia Preprocedure Evaluation (Signed)
Anesthesia Evaluation  Patient identified by MRN, date of birth, ID band Patient awake    Reviewed: Allergy & Precautions, H&P , NPO status , Patient's Chart, lab work & pertinent test results  Airway Mallampati: II TM Distance: >3 FB Neck ROM: Full    Dental no notable dental hx.    Pulmonary COPD COPD inhaler, former smoker,  Squamous cell carcinoma of lung, stage I breath sounds clear to auscultation  Pulmonary exam normal + decreased breath sounds      Cardiovascular hypertension, Pt. on medications Rhythm:Regular Rate:Normal     Neuro/Psych Anxiety negative neurological ROS     GI/Hepatic negative GI ROS, Neg liver ROS,   Endo/Other  negative endocrine ROS  Renal/GU negative Renal ROS  negative genitourinary   Musculoskeletal negative musculoskeletal ROS (+)   Abdominal   Peds negative pediatric ROS (+)  Hematology negative hematology ROS (+)   Anesthesia Other Findings   Reproductive/Obstetrics negative OB ROS                           Anesthesia Physical Anesthesia Plan  ASA: III  Anesthesia Plan: General   Post-op Pain Management:    Induction: Intravenous  Airway Management Planned: LMA  Additional Equipment:   Intra-op Plan:   Post-operative Plan:   Informed Consent: I have reviewed the patients History and Physical, chart, labs and discussed the procedure including the risks, benefits and alternatives for the proposed anesthesia with the patient or authorized representative who has indicated his/her understanding and acceptance.   Dental advisory given  Plan Discussed with: CRNA and Surgeon  Anesthesia Plan Comments:         Anesthesia Quick Evaluation

## 2013-12-24 NOTE — Op Note (Signed)
Beverly Francis, HARL NO.:  [id number]  MEDICAL RECORD NO.:  [id number]  LOCATION:                                 FACILITY:  PHYSICIAN:  Marshall Cork. Jeffie Pollock, M.D.    DATE OF BIRTH:  Oct 22, 1940  DATE OF PROCEDURE:  12/24/2013 DATE OF DISCHARGE:  12/24/2013                              OPERATIVE REPORT   PROCEDURE:  Cystoscopy with bladder biopsy and fulguration of 0.5-2 cm lesion.  SURGEON:  Marshall Cork. Jeffie Pollock, MD  PREOPERATIVE DIAGNOSIS:  Bladder lesion with positive cytology.  POSTOPERATIVE DIAGNOSIS:  Bladder lesion with positive cytology.  ANESTHESIA:  General.  SPECIMEN:  Biopsies from the right dome and left lateral wall.  DRAINS:  A 16-French Foley catheter.  COMPLICATIONS:  None.  INDICATIONS:  Ms. Jodene Nam is a 73 year old, white female with history of bladder cancer and prior BCG.  She has recently had cystoscopy which demonstrated a small erythematous areas on the bladder wall that were thought to be most consistent with treatment effect from her prior BCG, but she had a positive urine cytology and was found to have some enhancement of the left bladder wall on a CT scan.  The upper tracts were unremarkable.  It was felt that cystoscopy and bladder biopsy were indicated.  FINDINGS OF PROCEDURE:  She was taken to the operating room, where a general anesthetic was induced.  She was given Cipro.  She was placed in lithotomy position and was fitted with PAS hose.  The perineum and genitalia were prepped with Betadine solution and she was draped in usual sterile fashion.  Cystoscopy was performed using a 22-French scope and 12-degree lens. Examination revealed a normal urethra.  The bladder wall had mild trabeculation.  The ureteral orifices were unremarkable and had a normal anatomic position.  On the right dome of the bladder were 2 small erythematous lesions that did appear most consistent with prior treatment effect, but it was felt biopsy of these  lesions were indicated.  On the left lateral wall, there was a stellate scar from a prior resection and small area of erythema on the inferior margin of the scar.  No other mucosal abnormalities were noted.  After a thorough inspection of the bladder wall, 2 cup biopsies were obtained from the 2 small lesions on the right dome and a single cup biopsy was obtained from the erythematous area along the edge of the scar.  Once the biopsies were complete, the biopsy sites were fulgurated.  The lesions on the right lateral wall were nearly contiguous and the area of fulguration was approximately cm and a half.  The area on the left after fulguration was approximately 1 cm of treated area.  At this point, once hemostasis was achieved, the bladder was drained.  A 16-French Foley catheter was inserted.  The balloon was filled with 10 mL of sterile fluid.  The catheter was placed to straight drainage.  The patient was taken down from lithotomy position.  Her anesthetic was reversed.  She was moved to the recovery room in stable condition. There were no complications.     Marshall Cork. Jeffie Pollock, M.D.     JJW/MEDQ  D:  12/24/2013  T:  12/24/2013  Job:  [id number]

## 2013-12-26 ENCOUNTER — Encounter (HOSPITAL_BASED_OUTPATIENT_CLINIC_OR_DEPARTMENT_OTHER): Payer: Self-pay | Admitting: Urology

## 2014-01-06 ENCOUNTER — Other Ambulatory Visit: Payer: Self-pay

## 2014-01-06 DIAGNOSIS — G8918 Other acute postprocedural pain: Secondary | ICD-10-CM

## 2014-01-06 MED ORDER — TRAMADOL HCL 50 MG PO TABS: 50.0000 mg | ORAL_TABLET | Freq: Two times a day (BID) | ORAL | Status: DC | PRN

## 2014-01-06 NOTE — Telephone Encounter (Signed)
Tramadol RX refill Faxed to pt's pharm.

## 2014-01-07 ENCOUNTER — Other Ambulatory Visit: Payer: Self-pay

## 2014-01-07 DIAGNOSIS — G8918 Other acute postprocedural pain: Secondary | ICD-10-CM

## 2014-01-07 MED ORDER — TRAMADOL HCL 50 MG PO TABS: 50.0000 mg | ORAL_TABLET | Freq: Two times a day (BID) | ORAL | Status: DC | PRN

## 2014-01-07 NOTE — Telephone Encounter (Signed)
Re print rx again

## 2014-01-07 NOTE — Telephone Encounter (Signed)
Had to reprint RX for faxing

## 2014-03-11 ENCOUNTER — Other Ambulatory Visit: Payer: Self-pay | Admitting: *Deleted

## 2014-03-11 DIAGNOSIS — C3492 Malignant neoplasm of unspecified part of left bronchus or lung: Secondary | ICD-10-CM

## 2014-04-29 ENCOUNTER — Ambulatory Visit (INDEPENDENT_AMBULATORY_CARE_PROVIDER_SITE_OTHER): Payer: Medicare Other | Admitting: Thoracic Surgery (Cardiothoracic Vascular Surgery)

## 2014-04-29 ENCOUNTER — Encounter: Payer: Self-pay | Admitting: Thoracic Surgery (Cardiothoracic Vascular Surgery)

## 2014-04-29 ENCOUNTER — Ambulatory Visit
Admission: RE | Admit: 2014-04-29 | Discharge: 2014-04-29 | Disposition: A | Discharge status: Home / Self Care | Payer: Medicare Other | Source: Ambulatory Visit | Attending: Thoracic Surgery (Cardiothoracic Vascular Surgery) | Admitting: Thoracic Surgery (Cardiothoracic Vascular Surgery)

## 2014-04-29 VITALS — BP 154/79 | HR 85 | Resp 16 | Ht 63.5 in | Wt 105.0 lb

## 2014-04-29 DIAGNOSIS — IMO0002 Reserved for concepts with insufficient information to code with codable children: Secondary | ICD-10-CM

## 2014-04-29 DIAGNOSIS — C3492 Malignant neoplasm of unspecified part of left bronchus or lung: Secondary | ICD-10-CM

## 2014-04-29 DIAGNOSIS — I712 Thoracic aortic aneurysm, without rupture: Secondary | ICD-10-CM

## 2014-04-29 DIAGNOSIS — I7121 Aneurysm of the ascending aorta, without rupture: Secondary | ICD-10-CM

## 2014-04-29 DIAGNOSIS — C801 Malignant (primary) neoplasm, unspecified: Secondary | ICD-10-CM

## 2014-04-29 NOTE — Progress Notes (Signed)
HPI:  Beverly Francis returns today for a scheduled 6 month follow-up visit.  She is a 74 year old woman with a history of tobacco abuse who had a thoracoscopic left lower lobe superior segmentectomy on 11/13/2013. She had a T1a, N0, stage IA non-small cell carcinoma.  She was last seen in the office in late September about 3 weeks postop. She was doing well at that time but was still having a lot of incisional pain.  She complains of continuing incisional pain. She takes ibuprofen for that. She says that his gotten better over the past couple of weeks. She also complains of numbness in her left breast. She's not had any headaches or visual changes. Her appetite is good. She has not had any significant weight loss. She denies hemoptysis.   Patient Active Problem List   Diagnosis Date Noted  . Ascending aortic aneurysm 04/29/2014  . Status post partial lobectomy of lung 11/13/2013  . Abnormal EKG 10/03/2013  . Coronary artery calcification 10/03/2013  . 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 Bladder cancer 09/19/2013  . Osteoporosis 09/19/2013  . Hypertension 09/19/2013  . Squamous cell carcinoma of lung, stage I      Current Outpatient Prescriptions  Medication Sig Dispense Refill  . ADVAIR DISKUS 250-50 MCG/DOSE AEPB Inhale 1 puff into the lungs daily.     Marland Kitchen aspirin EC 81 MG tablet Take 81 mg by mouth daily.    . Calcium 600-200 MG-UNIT per tablet Take 1 tablet by mouth daily.    Marland Kitchen EPIPEN 2-PAK 0.3 MG/0.3ML SOAJ injection 0.3 mg once. prn    . lisinopril-hydrochlorothiazide (PRINZIDE,ZESTORETIC) 10-12.5 MG per tablet Take 1 tablet by mouth daily.   2   No current facility-administered medications for this visit.    Physical Exam BP 154/79 mmHg  Pulse 85  Resp 16  Ht 5' 3.5" (1.613 m)  Wt 105 lb (47.628 kg)  BMI 18.31 kg/m2  SpO36 33% 74 year old woman in no acute distress Alert and oriented 3 with no focal deficits No cervical or  supraclavicular adenopathy Lungs: Clear with equal breath sounds bilaterally Incisions well healed Cardiac regular rate and rhythm normal S1 and S2   Diagnostic Tests: CT CHEST WITHOUT CONTRAST  TECHNIQUE: Multidetector CT imaging of the chest was performed following the standard protocol without IV contrast.  COMPARISON: 12/04/2013  FINDINGS: Mediastinum/Nodes: Aortic, coronary, and branch vessel atherosclerotic calcification. Thoracic aortic ascending aneurysm, 4.2 cm diameter. No definite thoracic adenopathy, although sensitivity for hilar and mediastinal adenopathy is mildly reduced due to lack of IV contrast.  Lungs/Pleura: Stable pleura parenchymal scarring at the lung apices. Emphysema. No new or recurrent mass.  Upper abdomen: Fluid density lesion of the right mid kidney is partially imaged, probably a cyst. Atherosclerotic abdominal aorta.  Musculoskeletal: Unremarkable  IMPRESSION: 1. No recurrent malignancy identified. 2. Ascending thoracic aortic aneurysm, 44.2 cm in diameter. Recommend annual imaging followup by CTA or MRA. This recommendation follows 2010 ACCF/AHA/AATS/ACR/ASA/SCA/SCAI/SIR/STS/SVM Guidelines for the Diagnosis and Management of Patients with Thoracic Aortic Disease. Circulation. 2010; 121: W098-J191 3. Atherosclerosis. 4. Emphysema.   Electronically Signed  By: Beverly Francis M.D.  On: 04/29/2014 11:30   I personally reviewed the CT images. I recommendations are based on my assessment. There is a typo in the impression from radiology. She has a 4.2 cm ascending aneurysm (not a 44.2 cm).  Impression:  74 year old woman who is now 6 months out from a thoracoscopic superior segmentectomy for a stage IA non-small cell carcinoma.  She has no evidence of recurrent disease. We will plan to scan her again in about 6 months for her one-year follow-up. We will scan at six-month intervals with a first 2 years and then annually after  that.  She quit smoking in September around the time of her surgery. I reemphasized the importance of abstinence from tobacco.  She also has a 4.2 cm ascending aneurysm. This will need to be followed as well. It will be followed with the same CTs that we use to follow her lung cancer.   Her blood pressure is elevated today. I do not want to overreact to a single measurement. I did caution her that as to the importance of blood pressure control in the setting of an aneurysm. She currently is taking lisinopril and hydrochlorothiazide. I will defer her blood pressure management to Beverly Francis.  Plan: Return in 6 months with CT of chest   Beverly Lipps C. Roxan Hockey, MD [facility] [phone]

## 2014-09-15 ENCOUNTER — Other Ambulatory Visit: Payer: Self-pay | Admitting: *Deleted

## 2014-09-15 DIAGNOSIS — C3432 Malignant neoplasm of lower lobe, left bronchus or lung: Secondary | ICD-10-CM

## 2014-11-04 ENCOUNTER — Ambulatory Visit: Payer: Medicare Other | Admitting: Thoracic Surgery (Cardiothoracic Vascular Surgery)

## 2014-11-04 ENCOUNTER — Inpatient Hospital Stay: Admission: RE | Admit: 2014-11-04 | Payer: Medicare Other | Source: Ambulatory Visit

## 2014-11-12 ENCOUNTER — Ambulatory Visit
Admission: RE | Admit: 2014-11-12 | Discharge: 2014-11-12 | Disposition: A | Discharge status: Home / Self Care | Payer: Medicare Other | Source: Ambulatory Visit | Attending: Thoracic Surgery (Cardiothoracic Vascular Surgery) | Admitting: Thoracic Surgery (Cardiothoracic Vascular Surgery)

## 2014-11-12 DIAGNOSIS — C3432 Malignant neoplasm of lower lobe, left bronchus or lung: Secondary | ICD-10-CM

## 2014-11-18 ENCOUNTER — Encounter: Payer: Self-pay | Admitting: Thoracic Surgery (Cardiothoracic Vascular Surgery)

## 2014-11-18 ENCOUNTER — Ambulatory Visit (INDEPENDENT_AMBULATORY_CARE_PROVIDER_SITE_OTHER): Payer: Medicare Other | Admitting: Thoracic Surgery (Cardiothoracic Vascular Surgery)

## 2014-11-18 VITALS — BP 137/84 | HR 77 | Resp 16 | Ht 63.5 in | Wt 105.0 lb

## 2014-11-18 DIAGNOSIS — I712 Thoracic aortic aneurysm, without rupture: Secondary | ICD-10-CM

## 2014-11-18 DIAGNOSIS — Z8551 Personal history of malignant neoplasm of bladder: Secondary | ICD-10-CM

## 2014-11-18 DIAGNOSIS — C3432 Malignant neoplasm of lower lobe, left bronchus or lung: Secondary | ICD-10-CM

## 2014-11-18 DIAGNOSIS — I7121 Aneurysm of the ascending aorta, without rupture: Secondary | ICD-10-CM

## 2014-11-18 NOTE — Progress Notes (Signed)
[address]       [place] [address]             [phone]       HPI:  Beverly Francis returns for a scheduled 1 year follow-up visit.  She is a 74 year old woman with a history of tobacco abuse who had a thoracoscopic left lower lobe superior segmentectomy on 11/13/2013. She had a T1a, N0, stage IA non-small cell carcinoma.  She was last seen in the office in February for 6 month follow-up visit. She had no evidence of recurrent disease at that time.  She says she's been feeling well. She does not have the same stamina she had prior to her lobectomy. She's not had any recent problems with asthma or wheezing. She has an occasional cough, but denies hemoptysis. She hasn't lost weight, but has been unable to gain weight. She says her appetite is good but she just fills up quickly. She recently had cataract surgery and still has some blurred vision from that. She has not had any unusual headaches or other visual changes.  Past Medical History  Diagnosis Date  . Bladder cancer   . Anxiety     takes Xanax daily as needed  . COPD (chronic obstructive pulmonary disease)     Advair daily and Ventolin as needed  . Hypertension     takes Lisinopril daily  . Lung mass     left  . Shortness of breath     with exertion  . Arthritis   . Osteoporosis   . History of colon polyps   . Urinary frequency   . Nocturia   . Cataracts, bilateral       Current Outpatient Prescriptions  Medication Sig Dispense Refill  . ADVAIR DISKUS 250-50 MCG/DOSE AEPB Inhale 1 puff into the lungs daily.     Marland Kitchen aspirin EC 81 MG tablet Take 81 mg by mouth daily.    . Calcium 600-200 MG-UNIT per tablet Take 1 tablet by mouth daily.    Marland Kitchen EPIPEN 2-PAK 0.3 MG/0.3ML SOAJ injection 0.3 mg once. prn    . lisinopril-hydrochlorothiazide (PRINZIDE,ZESTORETIC) 10-12.5 MG per tablet Take 1 tablet by mouth daily.   2   No current facility-administered medications for this visit.    Physical  Exam BP 137/84 mmHg  Pulse 77  Resp 16  Ht 5' 3.5" (1.613 m)  Wt 105 lb (47.628 kg)  BMI 18.31 kg/m2  SpO32 87% 74 year old woman in no acute distress Well-developed and well-nourished Alert and oriented 3 with no focal neurologic deficits No cervical or subclavicular adenopathy Lungs clear Cardiac regular rate and rhythm  Diagnostic Tests: CT CHEST WITHOUT CONTRAST  TECHNIQUE: Multidetector CT imaging of the chest was performed following the standard protocol without IV contrast.  COMPARISON: 04/29/2014.  FINDINGS: Mediastinum/Nodes: No pathologically enlarged mediastinal or axillary lymph nodes. Hilar regions are difficult to definitively evaluate without IV contrast. Ascending aorta measures 4.1 cm, stable. Three-vessel coronary artery calcification. Heart size normal. No pericardial effusion.  Lungs/Pleura: Right apical pleural parenchymal scarring. Mild centrilobular emphysema. Postoperative changes and scarring are seen in the left lower lobe. Minimal scattered bronchiectasis. Lungs are otherwise clear. No pleural fluid. Airway is otherwise unremarkable.  Upper abdomen: Visualized portions of the liver and adrenal glands are unremarkable. 2.3 cm low-attenuation lesion in the right kidney is incompletely imaged. Probable renal vascular calcification on the left. Visualized portions of the spleen, pancreas and stomach are grossly unremarkable.  Musculoskeletal: No worrisome  lytic or sclerotic lesions. Degenerative changes are seen in the spine.  IMPRESSION: 1. Postoperative changes in the left lower lobe without evidence of recurrent or metastatic disease. 2. Ascending aortic aneurysm. Recommend annual imaging followup by CTA or MRA. This recommendation follows 2010 ACCF/AHA/AATS/ACR/ASA/SCA/SCAI/SIR/STS/SVM Guidelines for the Diagnosis and Management of Patients with Thoracic Aortic Disease. Circulation. 2010; 121: B559-R416 3. Three-vessel coronary  artery calcification.   Electronically Signed  By: Lorin Picket M.D.  On: 11/12/2014 08:29   I personally reviewed the CT chest and concur with the findings as noted above. There is no evidence recurrent disease and no change in the ascending aneurysm.  Impression: 74 year old woman who is now a year out from a thoracoscopic left lower lobe superior segmentectomy for stage IA squamous cell carcinoma. She has no evidence of recurrent disease.  She will need continued follow-up at six-month intervals for another year and then annually after that.  She also has a 4.1 cm ascending aneurysm. That is unchanged over the past 6 months. She will need continued follow-up that as well. Her blood pressure is well controlled.  Plan:  Return in 6 months with CT of chest to follow-up for lung cancer and ascending aneurysm Beverly Nakayama, MD [facility] [phone]

## 2015-05-06 ENCOUNTER — Other Ambulatory Visit: Payer: Self-pay | Admitting: Thoracic Surgery (Cardiothoracic Vascular Surgery)

## 2015-05-06 DIAGNOSIS — C3490 Malignant neoplasm of unspecified part of unspecified bronchus or lung: Secondary | ICD-10-CM

## 2015-05-18 ENCOUNTER — Other Ambulatory Visit: Payer: Self-pay | Admitting: Thoracic Surgery (Cardiothoracic Vascular Surgery)

## 2015-05-18 DIAGNOSIS — Z8551 Personal history of malignant neoplasm of bladder: Secondary | ICD-10-CM | POA: Diagnosis not present

## 2015-05-18 DIAGNOSIS — N3941 Urge incontinence: Secondary | ICD-10-CM | POA: Diagnosis not present

## 2015-05-19 ENCOUNTER — Ambulatory Visit
Admission: RE | Admit: 2015-05-19 | Discharge: 2015-05-19 | Disposition: A | Discharge status: Home / Self Care | Payer: Medicare Other | Source: Ambulatory Visit | Attending: Thoracic Surgery (Cardiothoracic Vascular Surgery) | Admitting: Thoracic Surgery (Cardiothoracic Vascular Surgery)

## 2015-05-19 ENCOUNTER — Other Ambulatory Visit: Payer: Self-pay | Admitting: Thoracic Surgery (Cardiothoracic Vascular Surgery)

## 2015-05-19 ENCOUNTER — Encounter: Payer: Self-pay | Admitting: Thoracic Surgery (Cardiothoracic Vascular Surgery)

## 2015-05-19 ENCOUNTER — Ambulatory Visit (INDEPENDENT_AMBULATORY_CARE_PROVIDER_SITE_OTHER): Payer: Medicare Other | Admitting: Thoracic Surgery (Cardiothoracic Vascular Surgery)

## 2015-05-19 VITALS — BP 118/70 | HR 88 | Resp 20 | Ht 63.5 in | Wt 100.0 lb

## 2015-05-19 DIAGNOSIS — C3432 Malignant neoplasm of lower lobe, left bronchus or lung: Secondary | ICD-10-CM

## 2015-05-19 DIAGNOSIS — I7121 Aneurysm of the ascending aorta, without rupture: Secondary | ICD-10-CM

## 2015-05-19 DIAGNOSIS — I712 Thoracic aortic aneurysm, without rupture: Secondary | ICD-10-CM

## 2015-05-19 DIAGNOSIS — C3490 Malignant neoplasm of unspecified part of unspecified bronchus or lung: Secondary | ICD-10-CM

## 2015-05-19 DIAGNOSIS — R918 Other nonspecific abnormal finding of lung field: Secondary | ICD-10-CM | POA: Diagnosis not present

## 2015-05-19 NOTE — Progress Notes (Signed)
[address]       [place] [address]             [phone]       HPI: Beverly Francis nurse if her scheduled 6 month follow-up visit.  She is a 75 year old woman with a history of tobacco abuse and COPD who had a left lower lobe superior segmentectomy for stage IA non-small cell carcinoma in September 2015. We are also following a 4.1 cm ascending aortic aneurysm.   I last saw her in the office in September 2016. That was her one-year follow-up visit. She was doing well at that time with no evidence of recurrent disease.  In the interim since her last visit she's been feeling well. She denies any chest pain. She has not had any worsening of her respiratory status. She does have cough and recently had an episode of bronchitis. She denies any significant weight loss.  Past Medical History  Diagnosis Date  . Bladder cancer (Beverly Francis)   . Anxiety     takes Xanax daily as needed  . COPD (chronic obstructive pulmonary disease) (HCC)     Advair daily and Ventolin as needed  . Hypertension     takes Lisinopril daily  . Lung mass     left  . Shortness of breath     with exertion  . Arthritis   . Osteoporosis   . History of colon polyps   . Urinary frequency   . Nocturia   . Cataracts, bilateral       Current Outpatient Prescriptions  Medication Sig Dispense Refill  . ADVAIR DISKUS 250-50 MCG/DOSE AEPB Inhale 1 puff into the lungs daily.     Beverly Francis Kitchen aspirin EC 81 MG tablet Take 81 mg by mouth daily.    . Calcium 600-200 MG-UNIT per tablet Take 1 tablet by mouth daily.    Beverly Francis Kitchen EPIPEN 2-PAK 0.3 MG/0.3ML SOAJ injection 0.3 mg once. prn    . lisinopril-hydrochlorothiazide (PRINZIDE,ZESTORETIC) 10-12.5 MG per tablet Take 1 tablet by mouth daily.   2  . VENTOLIN HFA 108 (90 Base) MCG/ACT inhaler Inhale 2 puffs into the lungs every 6 (six) hours as needed.   2   No current facility-administered medications for this visit.    Physical Exam BP 118/70 mmHg  Pulse 88  Resp  20  Ht 5' 3.5" (1.613 m)  Wt 100 lb (45.36 kg)  BMI 17.43 kg/m40  SpO15  75 year old woman in no acute distress Alert and oriented 3 with no focal deficits Lungs with diminished breath sounds bilaterally Cardiac regular rate and rhythm normal S1 and S2 No cervical or subclavicular adenopathy  Diagnostic Tests: CT CHEST WITHOUT CONTRAST  TECHNIQUE: Multidetector CT imaging of the chest was performed following the standard protocol without IV contrast.  COMPARISON: CT scan of November 12, 2014.  FINDINGS: No pneumothorax or pleural effusion is noted. Emphysematous disease is noted in the upper lobes bilaterally. Stable right apical scarring is noted. 1 cm irregular density is noted in right lateral costophrenic sulcus which was not present on prior exam. This may simply represent scarring or focal atelectasis, but neoplasm cannot be excluded. Probable minimal left posterior basilar subsegmental atelectasis is noted. New opacity is seen anteriorly in left upper lobe, most consistent with subsegmental atelectasis or possibly inflammation. New 6 mm nodule is noted posteriorly in the left upper lobe best seen on image number 9 of series 4. Atherosclerosis of thoracic aorta is noted. 4.1  cm ascending thoracic aortic aneurysm is noted which is stable compared to prior exam. No mediastinal mass or adenopathy is seen on these unenhanced images. Visualized portion of upper abdomen demonstrates atherosclerosis of abdominal aorta. No significant osseous abnormality is noted.  IMPRESSION: Probable minimal left posterior basilar subsegmental atelectasis is noted.  New mild opacity seen anteriorly in left upper lobe, most consistent with subsegmental atelectasis or possibly inflammation.  Atherosclerosis of thoracic aorta is noted. Stable 4.1 cm ascending thoracic aortic aneurysm is noted. Recommend annual imaging followup by CTA or MRA. This recommendation follows  2010 ACCF/AHA/AATS/ACR/ASA/SCA/SCAI/SIR/STS/SVM Guidelines for the Diagnosis and Management of Patients with Thoracic Aortic Disease. Circulation. 2010; 121: V013-H438.  New 1 cm density is seen in right lateral costophrenic sulcus which may simply represent scarring or subsegmental atelectasis. Also noted is new 6 mm nodule seen posteriorly in the left upper lobe. Given the history of lung cancer, follow-up unenhanced CT scan in 3 months is recommended to ensure resolution or stability, and evaluate for the possibility of neoplasm or malignancy.   Electronically Signed  By: Marijo Conception, M.D.  On: 05/19/2015 14:39  I personally reviewed the CT chest and concur with the findings noted above Impression: 75 year old woman who had a left lower lobe superior segmentectomy 18 months ago for a stage IA non-small cell carcinoma. She is also being followed for 4.1 centimeter ascending aortic aneurysm.  1. Lung cancer-  her CT today shows 2 questionable areas. There is a 6 mm possible nodule in the left apex. This is concerning but still relatively small and below the resolution for PET/CT. The second lesion is in the right costophrenic angle. Although a little larger this appears more likely to be an area of atelectasis or inflammation, although neoplasm cannot be ruled out. She needs a repeat CT in 3 months to reevaluate these areas.  2. Ascending aneurysm- remains 4.1 cm. Needs continued follow-up   Plan: Return in 3 months with CT chest to reevaluate possible new lung nodules   Melrose Nakayama, MD [facility] [phone]

## 2015-07-29 ENCOUNTER — Other Ambulatory Visit: Payer: Self-pay | Admitting: *Deleted

## 2015-07-29 DIAGNOSIS — R918 Other nonspecific abnormal finding of lung field: Secondary | ICD-10-CM

## 2015-08-05 DIAGNOSIS — Z961 Presence of intraocular lens: Secondary | ICD-10-CM | POA: Diagnosis not present

## 2015-08-05 DIAGNOSIS — H1851 Endothelial corneal dystrophy: Secondary | ICD-10-CM | POA: Diagnosis not present

## 2015-08-05 DIAGNOSIS — H2512 Age-related nuclear cataract, left eye: Secondary | ICD-10-CM | POA: Diagnosis not present

## 2015-08-18 DIAGNOSIS — H2512 Age-related nuclear cataract, left eye: Secondary | ICD-10-CM | POA: Diagnosis not present

## 2015-08-24 DIAGNOSIS — H2512 Age-related nuclear cataract, left eye: Secondary | ICD-10-CM | POA: Diagnosis not present

## 2015-08-24 DIAGNOSIS — H268 Other specified cataract: Secondary | ICD-10-CM | POA: Diagnosis not present

## 2015-08-25 ENCOUNTER — Inpatient Hospital Stay: Admission: RE | Admit: 2015-08-25 | Payer: Medicare Other | Source: Ambulatory Visit

## 2015-08-25 ENCOUNTER — Encounter: Payer: Medicare Other | Admitting: Thoracic Surgery (Cardiothoracic Vascular Surgery)

## 2015-08-25 NOTE — Progress Notes (Signed)
This encounter was created in error - please disregard.

## 2015-09-15 ENCOUNTER — Encounter: Payer: Self-pay | Admitting: Thoracic Surgery (Cardiothoracic Vascular Surgery)

## 2015-09-15 ENCOUNTER — Ambulatory Visit
Admission: RE | Admit: 2015-09-15 | Discharge: 2015-09-15 | Disposition: A | Discharge status: Home / Self Care | Payer: Medicare Other | Source: Ambulatory Visit | Attending: Thoracic Surgery (Cardiothoracic Vascular Surgery) | Admitting: Thoracic Surgery (Cardiothoracic Vascular Surgery)

## 2015-09-15 ENCOUNTER — Ambulatory Visit (INDEPENDENT_AMBULATORY_CARE_PROVIDER_SITE_OTHER): Payer: Medicare Other | Admitting: Thoracic Surgery (Cardiothoracic Vascular Surgery)

## 2015-09-15 VITALS — BP 134/85 | HR 74 | Resp 16 | Ht 63.5 in | Wt 100.0 lb

## 2015-09-15 DIAGNOSIS — C3432 Malignant neoplasm of lower lobe, left bronchus or lung: Secondary | ICD-10-CM

## 2015-09-15 DIAGNOSIS — R911 Solitary pulmonary nodule: Secondary | ICD-10-CM | POA: Diagnosis not present

## 2015-09-15 DIAGNOSIS — Z09 Encounter for follow-up examination after completed treatment for conditions other than malignant neoplasm: Secondary | ICD-10-CM

## 2015-09-15 DIAGNOSIS — I7121 Aneurysm of the ascending aorta, without rupture: Secondary | ICD-10-CM

## 2015-09-15 DIAGNOSIS — R918 Other nonspecific abnormal finding of lung field: Secondary | ICD-10-CM | POA: Diagnosis not present

## 2015-09-15 DIAGNOSIS — I712 Thoracic aortic aneurysm, without rupture: Secondary | ICD-10-CM

## 2015-09-15 NOTE — Progress Notes (Signed)
[address]       [place] [address]             [phone]       HPI: Mrs. Dauria he returns today for a scheduled 3 month follow-up visit.   She is a 75 year old woman with a history tobacco abuse and COPD. I did a left lower lobe superior segmentectomy in September 2015 for a stage IA non-small cell carcinoma. She did not require adjuvant treatment. She is also being followed for 4.1 cm ascending aneurysm.  I saw her in the office in March. Her CT showed the aneurysm was unchanged. There was a new 6 mm nodule in the left apex there was also an opacity in the right lower lobe in the costophrenic angle that appeared more likely to be atelectasis. We recommended a repeat CT at 3 months to follow up on these issues.  She still is doing reasonable well overall. She says that her appetite is poor. She has not experienced any weight loss. She denies any chest pain. Her COPD is well controlled with her Advair.  Past Medical History  Diagnosis Date  . Bladder cancer ([place])   . Anxiety     takes Xanax daily as needed  . COPD (chronic obstructive pulmonary disease) (HCC)     Advair daily and Ventolin as needed  . Hypertension     takes Lisinopril daily  . Lung mass     left  . Shortness of breath     with exertion  . Arthritis   . Osteoporosis   . History of colon polyps   . Urinary frequency   . Nocturia   . Cataracts, bilateral      Current Outpatient Prescriptions  Medication Sig Dispense Refill  . ADVAIR DISKUS 250-50 MCG/DOSE AEPB Inhale 1 puff into the lungs daily.     Marland Kitchen aspirin EC 81 MG tablet Take 81 mg by mouth daily.    . Calcium 600-200 MG-UNIT per tablet Take 1 tablet by mouth daily.    Marland Kitchen EPIPEN 2-PAK 0.3 MG/0.3ML SOAJ injection 0.3 mg once. prn    . lisinopril-hydrochlorothiazide (PRINZIDE,ZESTORETIC) 10-12.5 MG per tablet Take 1 tablet by mouth daily.   2  . VENTOLIN HFA 108 (90 Base) MCG/ACT inhaler Inhale 2 puffs into the lungs every 6  (six) hours as needed.   2   No current facility-administered medications for this visit.    Physical Exam BP 134/85 mmHg  Pulse 74  Resp 16  Ht 5' 3.5" (1.613 m)  Wt 100 lb (45.36 kg)  BMI 17.43 kg/m2  SpO2 42% Thin 75 year old woman in no acute distress Neuro tremor present, no motor deficit No cervical or subclavicular adenopathy Cardiac regular rate and rhythm Lungs faint wheezing bilaterally   Diagnostic Tests: CT CHEST WITHOUT CONTRAST  TECHNIQUE: Multidetector CT imaging of the chest was performed following the standard protocol without IV contrast.  COMPARISON: 05/19/2015  FINDINGS: Cardiovascular: The heart size appeared normal. Aortic atherosclerosis noted. The transverse aortic scratch set the ascending thoracic aorta measures 4.2 cm, image 63 of series 3. Calcifications involving the RCA, LAD and left circumflex Coronary artery noted.  Mediastinum/Nodes: The trachea appears patent and is midline. Normal appearance of the esophagus. No enlarged mediastinal or hilar lymph nodes.  Lungs/Pleura: No pleural effusion. Moderate to advanced changes of centrilobular and paraseptal emphysema noted. Within the left upper lobe there is a nodule which measures 7 x 6 mm (mean diameter  7 mm, image 18 of series 4. On the previous exam this had a mean diameter of 5 mm. New nodule within the posterior right upper lobe Measures 4 mm, image 31 of series 4. Diffuse bronchial wall thickening identified. No airspace consolidation.  Upper Abdomen: No focal liver abnormality. The adrenal glands are unremarkable. Cyst within the upper pole the right kidney is incompletely characterized without IV contrast measuring 2.4 cm. Aortic atherosclerosis noted.  Musculoskeletal: Mild spondylosis noted within the thoracic spine. There is a compression fracture involving the L3 vertebra with loss of 50% of the vertebral body height, chronic.  IMPRESSION: 1. There is a mild  increase in size of the left upper lobe lung nodule which now has a mean diameter of 7 mm. This remains too small to characterize by PET-CT. Non-contrast chest CT at 3-6 months is recommended. If the nodules are stable at time of repeat CT, then future CT at 18-24 months (from today's scan) is considered optional for low-risk patients, but is recommended for high-risk patients. This recommendation follows the consensus statement: Guidelines for Management of Incidental Pulmonary Nodules Detected on CT Images:From the Fleischner Society 2017; published online before print (10.1148/radiol.[id number]). 2. New nodule is identified within the right upper lobe which measures 4 mm. Attention on follow-up examination is advised. 3. Diffuse bronchial wall thickening with emphysema, as above; imaging findings suggestive of underlying COPD. 4. Aortic atherosclerosis 5. Increase caliber of the ascending thoracic aorta. Recommend annual imaging followup by CTA or MRA. This recommendation follows 2010 ACCF/AHA/AATS/ACR/ASA/SCA/SCAI/SIR/STS/SVM Guidelines for the Diagnosis and Management of Patients with Thoracic Aortic Disease. Circulation. 2010; 121: e266-e369 6. Chronic L3 compression fracture.   Electronically Signed  By: Kerby Moors M.D.  On: 09/15/2015 12:53  I personally reviewed the CT chest and concur with the findings noted above.  Impression: Mrs. Zeimet is a 75 year old woman with a history of tobacco abuse (quit in 2015) and a history of a stage IA non-small cell carcinoma resected in September 2015.  Lung nodules-the left lung nodule is slightly larger today 7 mm up from 6 mm. It is small and peripheral and would be difficult to sample. It also is below the limit of resolution with PET/CT. I think for now the best option with this to continue to follow it radiographically. I would plan to do another CT in 3 months.  She also has a small right upper lobe nodule that is new.  This is about 3 or 4 mm. It be followed with CT as well.  Ascending aneurysm- this may be slightly bigger but if so it's a millimeter or less on a noncontrast CT. I suspect it's really about the same. That will be followed as well.  Plan: Return in 3 months with CT chest  Melrose Nakayama, MD [facility] [phone]

## 2015-09-29 DIAGNOSIS — R350 Frequency of micturition: Secondary | ICD-10-CM | POA: Diagnosis not present

## 2015-09-29 DIAGNOSIS — J449 Chronic obstructive pulmonary disease, unspecified: Secondary | ICD-10-CM | POA: Diagnosis not present

## 2015-09-29 DIAGNOSIS — Z1231 Encounter for screening mammogram for malignant neoplasm of breast: Secondary | ICD-10-CM | POA: Diagnosis not present

## 2015-09-29 DIAGNOSIS — R634 Abnormal weight loss: Secondary | ICD-10-CM | POA: Diagnosis not present

## 2015-09-29 DIAGNOSIS — M810 Age-related osteoporosis without current pathological fracture: Secondary | ICD-10-CM | POA: Diagnosis not present

## 2015-09-29 DIAGNOSIS — Z1389 Encounter for screening for other disorder: Secondary | ICD-10-CM | POA: Diagnosis not present

## 2015-09-29 DIAGNOSIS — Z0001 Encounter for general adult medical examination with abnormal findings: Secondary | ICD-10-CM | POA: Diagnosis not present

## 2015-09-29 DIAGNOSIS — I712 Thoracic aortic aneurysm, without rupture: Secondary | ICD-10-CM | POA: Diagnosis not present

## 2015-09-29 DIAGNOSIS — C3490 Malignant neoplasm of unspecified part of unspecified bronchus or lung: Secondary | ICD-10-CM | POA: Diagnosis not present

## 2015-09-29 DIAGNOSIS — I10 Essential (primary) hypertension: Secondary | ICD-10-CM | POA: Diagnosis not present

## 2015-10-22 DIAGNOSIS — R3121 Asymptomatic microscopic hematuria: Secondary | ICD-10-CM | POA: Diagnosis not present

## 2015-10-22 DIAGNOSIS — Z8551 Personal history of malignant neoplasm of bladder: Secondary | ICD-10-CM | POA: Diagnosis not present

## 2015-11-10 DIAGNOSIS — Z1211 Encounter for screening for malignant neoplasm of colon: Secondary | ICD-10-CM | POA: Diagnosis not present

## 2015-11-26 ENCOUNTER — Other Ambulatory Visit: Payer: Self-pay | Admitting: Thoracic Surgery (Cardiothoracic Vascular Surgery)

## 2015-11-26 DIAGNOSIS — R918 Other nonspecific abnormal finding of lung field: Secondary | ICD-10-CM

## 2015-12-03 DIAGNOSIS — Z23 Encounter for immunization: Secondary | ICD-10-CM | POA: Diagnosis not present

## 2015-12-08 DIAGNOSIS — Z1212 Encounter for screening for malignant neoplasm of rectum: Secondary | ICD-10-CM | POA: Diagnosis not present

## 2015-12-08 DIAGNOSIS — Z1211 Encounter for screening for malignant neoplasm of colon: Secondary | ICD-10-CM | POA: Diagnosis not present

## 2015-12-08 DIAGNOSIS — Z961 Presence of intraocular lens: Secondary | ICD-10-CM | POA: Diagnosis not present

## 2015-12-22 ENCOUNTER — Encounter: Payer: Self-pay | Admitting: Thoracic Surgery (Cardiothoracic Vascular Surgery)

## 2015-12-22 ENCOUNTER — Ambulatory Visit (INDEPENDENT_AMBULATORY_CARE_PROVIDER_SITE_OTHER): Payer: Medicare Other | Admitting: Thoracic Surgery (Cardiothoracic Vascular Surgery)

## 2015-12-22 ENCOUNTER — Ambulatory Visit
Admission: RE | Admit: 2015-12-22 | Discharge: 2015-12-22 | Disposition: A | Discharge status: Home / Self Care | Payer: Medicare Other | Source: Ambulatory Visit | Attending: Thoracic Surgery (Cardiothoracic Vascular Surgery) | Admitting: Thoracic Surgery (Cardiothoracic Vascular Surgery)

## 2015-12-22 VITALS — BP 136/76 | HR 82 | Resp 20 | Ht 63.5 in | Wt 102.0 lb

## 2015-12-22 DIAGNOSIS — R918 Other nonspecific abnormal finding of lung field: Secondary | ICD-10-CM

## 2015-12-22 DIAGNOSIS — I712 Thoracic aortic aneurysm, without rupture: Secondary | ICD-10-CM

## 2015-12-22 DIAGNOSIS — I7121 Aneurysm of the ascending aorta, without rupture: Secondary | ICD-10-CM

## 2015-12-22 DIAGNOSIS — C3432 Malignant neoplasm of lower lobe, left bronchus or lung: Secondary | ICD-10-CM

## 2015-12-22 DIAGNOSIS — R911 Solitary pulmonary nodule: Secondary | ICD-10-CM

## 2015-12-22 DIAGNOSIS — Z8551 Personal history of malignant neoplasm of bladder: Secondary | ICD-10-CM | POA: Diagnosis not present

## 2015-12-22 NOTE — Progress Notes (Signed)
[address]       [place] [address]             [phone]       HPI: Ms. Kreuser returns for a scheduled 3 month follow-up visit.  She is a 75 year old woman with a history of tobacco abuse, COPD, and a 4.1 cm ascending aneurysm. I did a left lower lobe superior segmentectomy in September 2015 for stage IA non-small cell carcinoma. She did not require adjuvant therapy.  Saw in the office in March 2017. There was a new 6 mm nodule in the left apex and opacity in the right lower lobe in the costophrenic angle. A repeat CT at 3 months showed possible slight increase in size the left apical nodule, the right lower lobe opacity had resolved. Her aneurysm was stable. There was a new 3-4 mm right upper lobe nodule  In the interim since her last visit she has not had any new pulmonary issues. She still using her inhalers. Her appetite remains poor but she has not had significant weight loss. Her primary complaint is urinary frequency. She is on medication for that.  Past Medical History:  Diagnosis Date  . Anxiety    takes Xanax daily as needed  . Arthritis   . Bladder cancer ([place])   . Cataracts, bilateral   . COPD (chronic obstructive pulmonary disease) (HCC)    Advair daily and Ventolin as needed  . History of colon polyps   . Hypertension    takes Lisinopril daily  . Lung mass    left  . Nocturia   . Osteoporosis   . Shortness of breath    with exertion  . Urinary frequency     Current Outpatient Prescriptions  Medication Sig Dispense Refill  . ADVAIR DISKUS 250-50 MCG/DOSE AEPB Inhale 1 puff into the lungs daily.     Marland Kitchen aspirin EC 81 MG tablet Take 81 mg by mouth daily.    . Calcium 600-200 MG-UNIT per tablet Take 1 tablet by mouth daily.    Marland Kitchen EPIPEN 2-PAK 0.3 MG/0.3ML SOAJ injection 0.3 mg once. prn    . lisinopril-hydrochlorothiazide (PRINZIDE,ZESTORETIC) 10-12.5 MG per tablet Take 1 tablet by mouth daily.   2  . VENTOLIN HFA 108 (90 Base) MCG/ACT  inhaler Inhale 2 puffs into the lungs every 6 (six) hours as needed.   2   No current facility-administered medications for this visit.     Physical Exam BP 136/76 (BP Location: Left Arm, Patient Position: Sitting, Cuff Size: Small)   Pulse 82   Resp 20   Ht 5' 3.5" (1.613 m)   Wt 102 lb (46.3 kg)   SpO2 94% Comment: RA  BMI 17.38 kg/m  75 year old woman in no acute distress Thin Alert and oriented 3 with no focal deficits Cardiac regular rate and rhythm normal S1 and S2 Lungs diminished but clear with equal breath sounds bilaterally  Diagnostic Tests: CT CHEST WITHOUT CONTRAST  TECHNIQUE: Multidetector CT imaging of the chest was performed following the standard protocol without IV contrast.  COMPARISON:  None.  FINDINGS: Cardiovascular: The heart size appears normal. No pericardial effusion. Aortic atherosclerosis noted. Calcification within the RCA, LAD and left circumflex coronary artery noted.  Mediastinum/Nodes: No enlarged mediastinal or axillary lymph nodes. Thyroid gland, trachea, and esophagus demonstrate no significant findings.  Lungs/Pleura: No pleural effusion. Advanced changes of centrilobular emphysema noted. There is diffuse bronchial wall thickening identified. Pulmonary nodule within the posterior left  upper lobe measures 5 mm, image number 17 is series 4. Unchanged from previous exam peer posterior right upper lobe pulmonary nodule measures 3 mm, image 29 of series 4. Previously this measured the same. No new nodules.  Upper Abdomen: No acute abnormality.  Musculoskeletal: Scoliosis and degenerative disc disease identified within the thoracic spine.  IMPRESSION: 1. No change in left upper lobe and right upper lobe pulmonary nodules in this patient last history of lung cancer. Continued interval follow-up advised. No new nodules identified. 2. Emphysema 3. Aortic atherosclerosis and coronary artery  calcifications.   Electronically Signed   By: Kerby Moors M.D.   On: 12/22/2015 13:46 I personally reviewed the CT chest and concur with the findings noted above.  Impression: Ms. Durrett is a 75 year old woman who is now 2 years out from a superior segmentectomy of the left lower lobe for stage IA non-small cell carcinoma. Her CT today shows unchanged to improved pulmonary nodules bilaterally. I doubt this is metastatic disease, but they do warrant continued follow-up.  Ascending aneurysm- no change in size. Continued follow-up  COPD- stable with current regimen  Plan: Return in 6 months with CT chest follow-up lung nodules and ascending aneurysm.  Melrose Nakayama, MD [facility] [phone]

## 2016-01-25 DIAGNOSIS — R3121 Asymptomatic microscopic hematuria: Secondary | ICD-10-CM | POA: Diagnosis not present

## 2016-01-25 DIAGNOSIS — N3941 Urge incontinence: Secondary | ICD-10-CM | POA: Diagnosis not present

## 2016-01-25 DIAGNOSIS — Z8551 Personal history of malignant neoplasm of bladder: Secondary | ICD-10-CM | POA: Diagnosis not present

## 2016-01-26 DIAGNOSIS — Z8551 Personal history of malignant neoplasm of bladder: Secondary | ICD-10-CM | POA: Diagnosis not present

## 2016-02-15 IMAGING — CT CT ANGIO CHEST
3 of 6 series · 12 of 30 positions shown · IV contrast ([ID] OMNI 350)
Comparison: PET CT scan 09/16/2013.  CT chest 09/09/2013.

CLINICAL DATA: Shortness of breath and chest pain for 4 days.
History a of removal of the superior segmental left lower lobe
carcinoma and mediastinal lymph node dissection 11/13/2013.

EXAM:
CT ANGIOGRAPHY CHEST WITH CONTRAST
TECHNIQUE: Multidetector CT imaging of the chest was performed using the
standard protocol during bolus administration of intravenous
contrast. Multiplanar CT image reconstructions and MIPs were
obtained to evaluate the vascular anatomy.
CONTRAST:  100 mL OMNIPAQUE IOHEXOL 350 MG/ML SOLN

[Series 4: pe 1.25 · axial · 0.63mm/px · z∈[-256,-7]mm · 7 of 267 slices shown]
[im 34/267  lung]
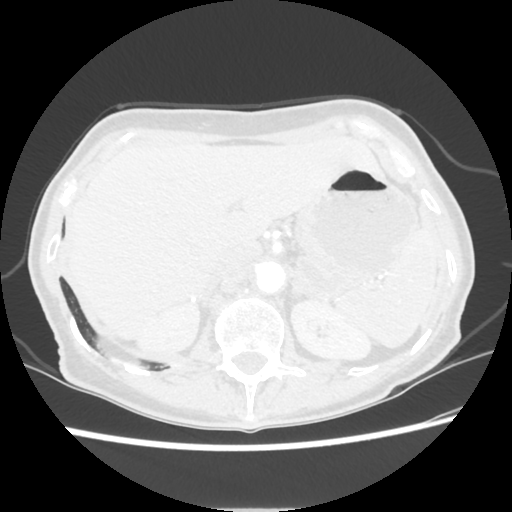
[im 67/267  mediastinal]
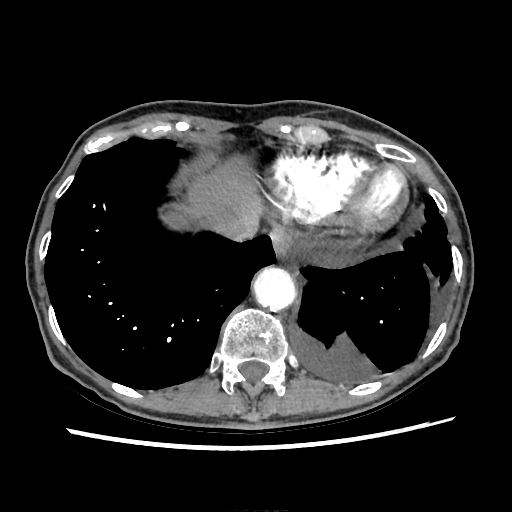
[im 100/267  lung]
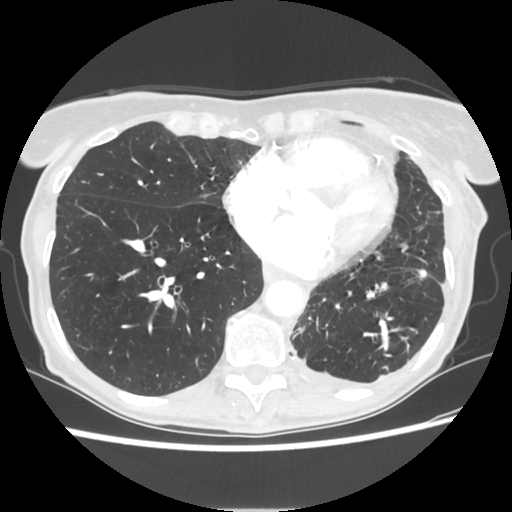
[im 134/267  mediastinal]
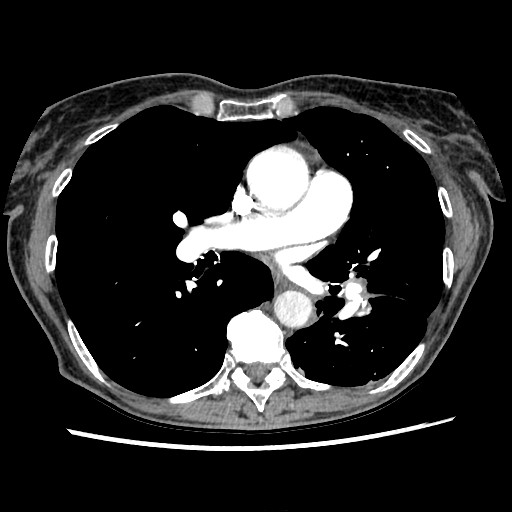
[im 167/267  lung]
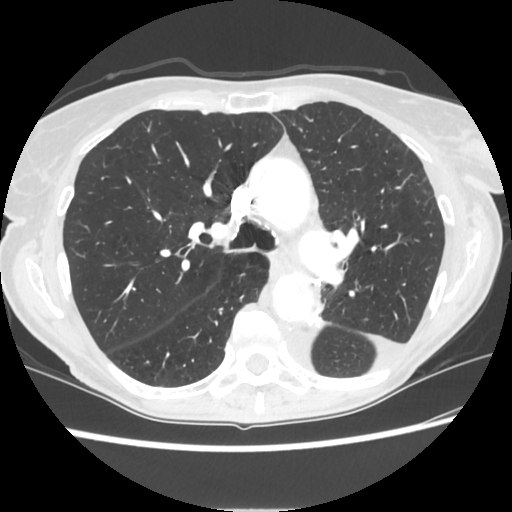
[im 200/267  mediastinal]
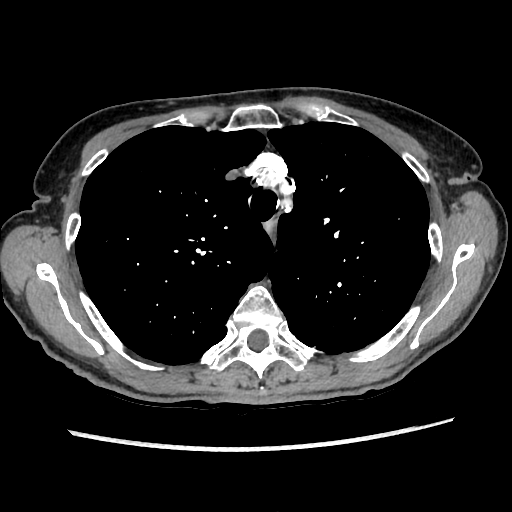
[im 233/267  lung]
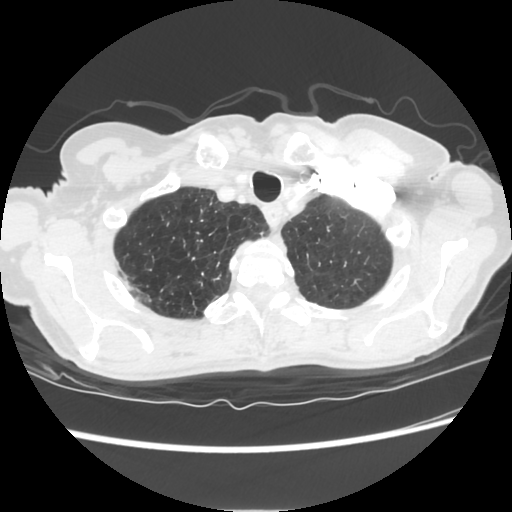

[Series 5: pe 2.5 · axial · 0.63mm/px · z∈[-214,-49]mm · 3 of 134 slices shown]
[im 34/134  lung]
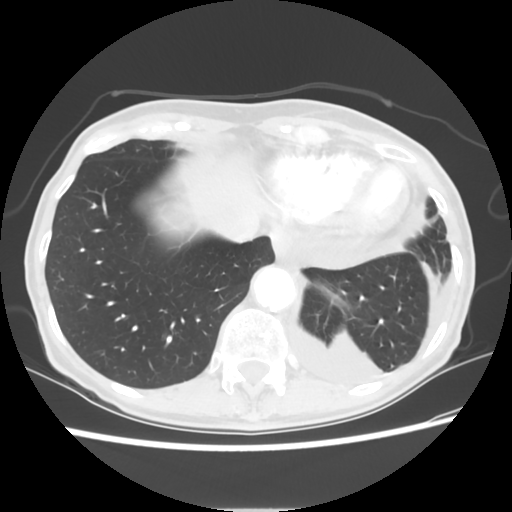
[im 67/134  lung]
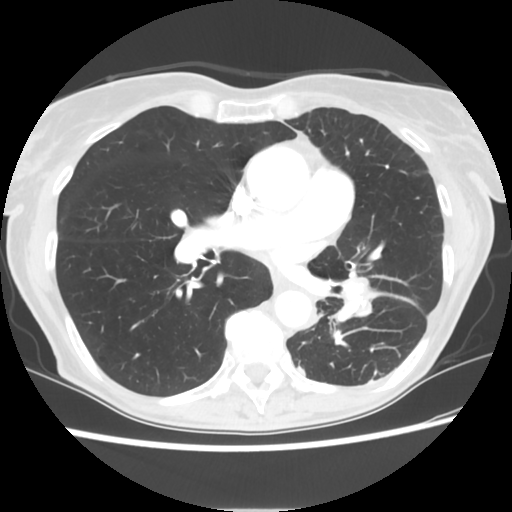
[im 100/134  lung]
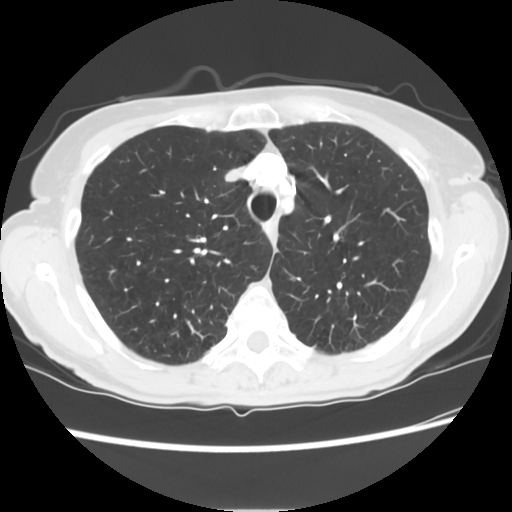

[Series 602: sagittal body · sagittal · 0.65mm/px · 2 of 130 slices shown]
[im 44/130  lung]
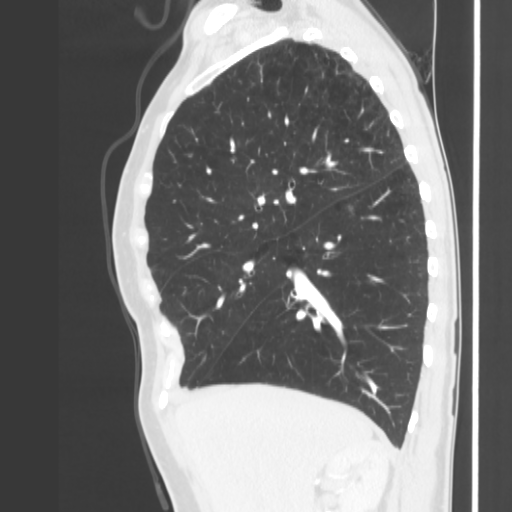
[im 87/130  lung]
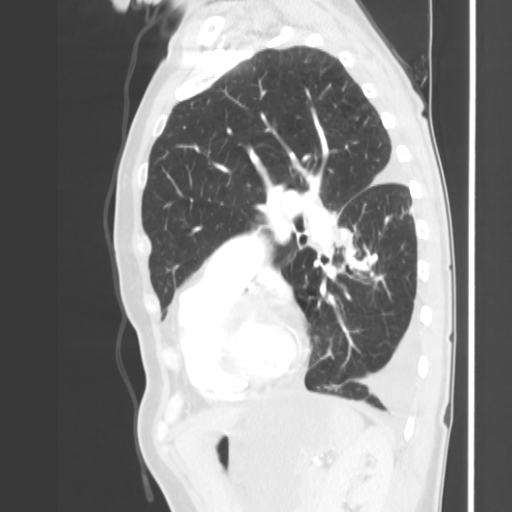

[12 of 30 positions shown; findings below may reference images not displayed]

FINDINGS: No pulmonary embolus is identified. Surgical clips are seen in the
left hilum consistent with history of resection of the superior
segment of the left lower lobe and mediastinal lymph node
dissection. There is a small left pleural effusion. No right pleural
effusion or pericardial effusion is identified. Calcific aortic and
coronary atherosclerosis is identified. No axillary, hilar or
mediastinal lymphadenopathy is identified. Lungs demonstrate
centrilobular emphysematous disease. Scarring in the right lung apex
is unchanged. No consolidative process, nodule or mass is
identified. No pneumothorax is seen.

Visualized upper abdomen demonstrates a cyst in the right kidney.
Aortic atherosclerosis is noted. No focal bony lesion is identified.

Review of the MIP images confirms the above findings.
IMPRESSION: Negative for pulmonary embolus or acute disease.

Status post resection of the superior segment of the left lower lobe
and mediastinal lymph node dissection. No evidence of residual or
recurrent disease is identified. Small left pleural effusion is
noted.

Emphysema.

Atherosclerosis.

## 2016-05-17 ENCOUNTER — Other Ambulatory Visit: Payer: Self-pay | Admitting: *Deleted

## 2016-05-17 DIAGNOSIS — R918 Other nonspecific abnormal finding of lung field: Secondary | ICD-10-CM

## 2016-06-21 ENCOUNTER — Ambulatory Visit (INDEPENDENT_AMBULATORY_CARE_PROVIDER_SITE_OTHER): Payer: Medicare Other | Admitting: Thoracic Surgery (Cardiothoracic Vascular Surgery)

## 2016-06-21 ENCOUNTER — Ambulatory Visit
Admission: RE | Admit: 2016-06-21 | Discharge: 2016-06-21 | Disposition: A | Discharge status: Home / Self Care | Payer: Medicare Other | Source: Ambulatory Visit | Attending: Thoracic Surgery (Cardiothoracic Vascular Surgery) | Admitting: Thoracic Surgery (Cardiothoracic Vascular Surgery)

## 2016-06-21 ENCOUNTER — Encounter: Payer: Self-pay | Admitting: Thoracic Surgery (Cardiothoracic Vascular Surgery)

## 2016-06-21 VITALS — BP 139/80 | HR 84 | Resp 20 | Ht 63.5 in | Wt 102.0 lb

## 2016-06-21 DIAGNOSIS — R918 Other nonspecific abnormal finding of lung field: Secondary | ICD-10-CM

## 2016-06-21 DIAGNOSIS — C3432 Malignant neoplasm of lower lobe, left bronchus or lung: Secondary | ICD-10-CM

## 2016-06-21 DIAGNOSIS — I712 Thoracic aortic aneurysm, without rupture: Secondary | ICD-10-CM

## 2016-06-21 DIAGNOSIS — I7121 Aneurysm of the ascending aorta, without rupture: Secondary | ICD-10-CM

## 2016-06-21 DIAGNOSIS — Z8551 Personal history of malignant neoplasm of bladder: Secondary | ICD-10-CM

## 2016-06-21 DIAGNOSIS — R911 Solitary pulmonary nodule: Secondary | ICD-10-CM

## 2016-06-21 NOTE — Progress Notes (Signed)
[address]       [place], [address]             [phone]    HPI: Ms. Szymanowski returns for scheduled 6 month follow-up visit  She is a 76 year old woman with a history of tobacco abuse (quit in 2015), COPD, and a 4.1 cm ascending aneurysm. I did a left lower lobe superior segmentectomy for stage IA non-small cell carcinoma in September 2015. She did not require adjuvant therapy.  I last saw in the office in October 2017. We were following a 6 mm left apical nodule as well as her ascending aneurysm. Her CT at that visit was unchanged.  He denies any new medical problems in the interim since her last visit. She says she feels well. Her appetite is good, but she says she cannot gain weight. She has not had any exacerbations of her COPD. She uses Advair daily and her rescue inhaler occasionally.  Past Medical History:  Diagnosis Date  . Anxiety    takes Xanax daily as needed  . Arthritis   . Bladder cancer ([place])   . Cataracts, bilateral   . COPD (chronic obstructive pulmonary disease) (HCC)    Advair daily and Ventolin as needed  . History of colon polyps   . Hypertension    takes Lisinopril daily  . Lung mass    left  . Nocturia   . Osteoporosis   . Shortness of breath    with exertion  . Urinary frequency     Current Outpatient Prescriptions  Medication Sig Dispense Refill  . ADVAIR DISKUS 250-50 MCG/DOSE AEPB Inhale 1 puff into the lungs daily.     Marland Kitchen aspirin EC 81 MG tablet Take 81 mg by mouth daily.    . Calcium 600-200 MG-UNIT per tablet Take 1 tablet by mouth daily.    Marland Kitchen EPIPEN 2-PAK 0.3 MG/0.3ML SOAJ injection 0.3 mg once. prn    . lisinopril-hydrochlorothiazide (PRINZIDE,ZESTORETIC) 10-12.5 MG per tablet Take 1 tablet by mouth daily.   2  . VENTOLIN HFA 108 (90 Base) MCG/ACT inhaler Inhale 2 puffs into the lungs every 6 (six) hours as needed.   2   No current facility-administered medications for this visit.     Physical Exam BP 139/80    Pulse 84   Resp 20   Ht 5' 3.5" (1.613 m)   Wt 102 lb (46.3 kg)   SpO2 98% Comment: RA  BMI 17.40 kg/m  76 year old woman in no acute distress Alert and oriented 3 with no focal neurologic deficits No cervical or supraclavicular adenopathy Lungs diminished bilaterally, no wheezing Regular rate and rhythm normal S1 and S2  Diagnostic Tests: CT CHEST WITHOUT CONTRAST  TECHNIQUE: Multidetector CT imaging of the chest was performed following the standard protocol without IV contrast.  COMPARISON:  Chest CT 12/22/2015, 09/15/2015 and 05/19/2015.  FINDINGS: Cardiovascular: There is extensive coronary artery atherosclerosis with lesser atherosclerosis of the aorta and great vessels. The ascending aorta is dilated to 4.2 cm, similar to previous study. No acute vascular findings are seen on noncontrast imaging. The heart size is normal. There is no pericardial effusion.  Mediastinum/Nodes: There are no enlarged mediastinal, hilar or axillary lymph nodes. The thyroid gland, trachea and esophagus demonstrate no significant findings.  Lungs/Pleura: There is no pleural effusion. Moderate emphysema and biapical scarring are again noted. There are stable postsurgical changes in the left lower lobe. The 6 x 5 mm left apical nodule image  number 19 appears unchanged. 3 mm right upper lobe nodule on image 31 appears unchanged. No new or enlarging nodules are seen.  Upper abdomen: The visualized upper abdomen appears stable with a mid right renal cysts. There is extensive aortic and branch vessel atherosclerosis.  Musculoskeletal/Chest wall: There is no chest wall mass or suspicious osseous finding. Chronic L3 compression deformity appears unchanged.  IMPRESSION: 1. Stable postoperative chest without evidence of local recurrence. 2. Small bilateral pulmonary nodules are unchanged from recent prior studies. Continued follow-up (6-12 months) recommended. 3. Emphysema, diffuse  atherosclerosis and ascending aortic aneurysm, grossly stable.   Electronically Signed   By: Richardean Sale M.D.   On: 06/21/2016 14:53 I personally reviewed the CT chest concurrent with the findings noted above.  Impression: Mrs. Besecker is a 76 year old woman who is now 2-1/2 years out from a left superior segmentectomy for a stage IA non-small cell carcinoma. She has no evidence of recurrent disease.   Lung nodules- she has multiple small lung nodules the largest being about 5.5 mm in the left upper lobe. This is been stable over time. There are no suspicious nodules at this time. She does need continued follow-up of the nodules that are present.  Ascending aneurysm- stable at 4.2 cm. No indication for surgery at this time. Primary treatment is a pressure control. She is on Zestoretic for that.  COPD- stable with no recent exacerbations  Plan: Return in 6 months with CT chest follow-up lung nodules and ascending aneurysm  Melrose Nakayama, MD [facility] [phone]

## 2016-07-27 DIAGNOSIS — Z8551 Personal history of malignant neoplasm of bladder: Secondary | ICD-10-CM | POA: Diagnosis not present

## 2016-07-27 DIAGNOSIS — N3941 Urge incontinence: Secondary | ICD-10-CM | POA: Diagnosis not present

## 2016-07-27 DIAGNOSIS — N3020 Other chronic cystitis without hematuria: Secondary | ICD-10-CM | POA: Diagnosis not present

## 2016-08-15 DIAGNOSIS — Z8551 Personal history of malignant neoplasm of bladder: Secondary | ICD-10-CM | POA: Diagnosis not present

## 2016-08-19 DIAGNOSIS — Z8551 Personal history of malignant neoplasm of bladder: Secondary | ICD-10-CM | POA: Diagnosis not present

## 2016-08-19 DIAGNOSIS — I700 Atherosclerosis of aorta: Secondary | ICD-10-CM | POA: Diagnosis not present

## 2016-08-19 DIAGNOSIS — N281 Cyst of kidney, acquired: Secondary | ICD-10-CM | POA: Diagnosis not present

## 2016-08-19 DIAGNOSIS — C679 Malignant neoplasm of bladder, unspecified: Secondary | ICD-10-CM | POA: Diagnosis not present

## 2016-09-30 DIAGNOSIS — Z85118 Personal history of other malignant neoplasm of bronchus and lung: Secondary | ICD-10-CM | POA: Diagnosis not present

## 2016-09-30 DIAGNOSIS — Z1231 Encounter for screening mammogram for malignant neoplasm of breast: Secondary | ICD-10-CM | POA: Diagnosis not present

## 2016-10-07 DIAGNOSIS — I10 Essential (primary) hypertension: Secondary | ICD-10-CM | POA: Diagnosis not present

## 2016-10-07 DIAGNOSIS — M810 Age-related osteoporosis without current pathological fracture: Secondary | ICD-10-CM | POA: Diagnosis not present

## 2016-10-07 DIAGNOSIS — J449 Chronic obstructive pulmonary disease, unspecified: Secondary | ICD-10-CM | POA: Diagnosis not present

## 2016-10-07 DIAGNOSIS — C3490 Malignant neoplasm of unspecified part of unspecified bronchus or lung: Secondary | ICD-10-CM | POA: Diagnosis not present

## 2016-10-24 DIAGNOSIS — Z0000 Encounter for general adult medical examination without abnormal findings: Secondary | ICD-10-CM | POA: Diagnosis not present

## 2016-10-24 DIAGNOSIS — I712 Thoracic aortic aneurysm, without rupture: Secondary | ICD-10-CM | POA: Diagnosis not present

## 2016-10-24 DIAGNOSIS — Z1389 Encounter for screening for other disorder: Secondary | ICD-10-CM | POA: Diagnosis not present

## 2016-10-24 DIAGNOSIS — I10 Essential (primary) hypertension: Secondary | ICD-10-CM | POA: Diagnosis not present

## 2016-11-16 DIAGNOSIS — Z23 Encounter for immunization: Secondary | ICD-10-CM | POA: Diagnosis not present

## 2016-11-22 DIAGNOSIS — N3020 Other chronic cystitis without hematuria: Secondary | ICD-10-CM | POA: Diagnosis not present

## 2016-11-28 DIAGNOSIS — N3941 Urge incontinence: Secondary | ICD-10-CM | POA: Diagnosis not present

## 2016-11-28 DIAGNOSIS — D090 Carcinoma in situ of bladder: Secondary | ICD-10-CM | POA: Diagnosis not present

## 2016-11-28 DIAGNOSIS — N3020 Other chronic cystitis without hematuria: Secondary | ICD-10-CM | POA: Diagnosis not present

## 2016-11-29 ENCOUNTER — Other Ambulatory Visit: Payer: Self-pay | Admitting: *Deleted

## 2016-11-29 DIAGNOSIS — I7121 Aneurysm of the ascending aorta, without rupture: Secondary | ICD-10-CM

## 2016-11-29 DIAGNOSIS — R0602 Shortness of breath: Secondary | ICD-10-CM

## 2016-11-29 DIAGNOSIS — I712 Thoracic aortic aneurysm, without rupture: Secondary | ICD-10-CM

## 2016-11-29 DIAGNOSIS — R918 Other nonspecific abnormal finding of lung field: Secondary | ICD-10-CM

## 2016-12-06 ENCOUNTER — Other Ambulatory Visit: Payer: Self-pay | Admitting: Urology

## 2016-12-08 NOTE — Patient Instructions (Signed)
Beverly Francis  12/08/2016   Your procedure is scheduled on: 12/13/16  Report to [facility] Main  Entrance Take [place]  elevators to 3rd floor to  [place] at   [place] AM.    Call this number if you have problems the morning of surgery [phone]    Remember: ONLY 1 PERSON MAY GO WITH YOU TO SHORT STAY TO GET  READY MORNING OF [place].  Do not eat food or drink liquids :After Midnight.     Take these medicines the morning of surgery with A SIP OF WATER: Toviaz, inhalers and bring                                You may not have any metal on your body including hair pins and              piercings  Do not wear jewelry, make-up, lotions, powders or perfumes, deodorant             Do not wear nail polish.  Do not shave  48 hours prior to surgery.                Do not bring valuables to the hospital. [place].  Contacts, dentures or bridgework may not be worn into surgery.      Patients discharged the day of surgery will not be allowed to drive home.  Name and phone number of your driver:  Special Instructions: N/A              Please read over the following fact sheets you were given: _____________________________________________________________________          [facility] - Preparing for Surgery Before surgery, you can play an important role.  Because skin is not sterile, your skin needs to be as free of germs as possible.  You can reduce the number of germs on your skin by washing with CHG (chlorahexidine gluconate) soap before surgery.  CHG is an antiseptic cleaner which kills germs and bonds with the skin to continue killing germs even after washing. Please DO NOT use if you have an allergy to CHG or antibacterial soaps.  If your skin becomes reddened/irritated stop using the CHG and inform your nurse when you arrive at Short Stay. Do not shave (including legs and underarms) for at least  48 hours prior to the first CHG shower.  You may shave your face/neck. Please follow these instructions carefully:  1.  Shower with CHG Soap the night before surgery and the  morning of Surgery.  2.  If you choose to wash your hair, wash your hair first as usual with your  normal  shampoo.  3.  After you shampoo, rinse your hair and body thoroughly to remove the  shampoo.                           4.  Use CHG as you would any other liquid soap.  You can apply chg directly  to the skin and wash                       Gently with a scrungie  or clean washcloth.  5.  Apply the CHG Soap to your body ONLY FROM THE NECK DOWN.   Do not use on face/ open                           Wound or open sores. Avoid contact with eyes, ears mouth and genitals (private parts).                       Wash face,  Genitals (private parts) with your normal soap.             6.  Wash thoroughly, paying special attention to the area where your surgery  will be performed.  7.  Thoroughly rinse your body with warm water from the neck down.  8.  DO NOT shower/wash with your normal soap after using and rinsing off  the CHG Soap.                9.  Pat yourself dry with a clean towel.            10.  Wear clean pajamas.            11.  Place clean sheets on your bed the night of your first shower and do not  sleep with pets. Day of Surgery : Do not apply any lotions/deodorants the morning of surgery.  Please wear clean clothes to the hospital/surgery center.  FAILURE TO FOLLOW THESE INSTRUCTIONS MAY RESULT IN THE CANCELLATION OF YOUR SURGERY PATIENT SIGNATURE_________________________________  NURSE SIGNATURE__________________________________  ________________________________________________________________________

## 2016-12-09 ENCOUNTER — Encounter (HOSPITAL_COMMUNITY)
Admission: RE | Admit: 2016-12-09 | Discharge: 2016-12-09 | Disposition: A | Discharge status: Home / Self Care | Payer: Medicare Other | Source: Ambulatory Visit | Attending: Urology | Admitting: Urology

## 2016-12-09 ENCOUNTER — Encounter (HOSPITAL_COMMUNITY): Payer: Self-pay

## 2016-12-09 DIAGNOSIS — R9431 Abnormal electrocardiogram [ECG] [EKG]: Secondary | ICD-10-CM | POA: Insufficient documentation

## 2016-12-09 DIAGNOSIS — Z01810 Encounter for preprocedural cardiovascular examination: Secondary | ICD-10-CM | POA: Insufficient documentation

## 2016-12-09 DIAGNOSIS — Z8551 Personal history of malignant neoplasm of bladder: Secondary | ICD-10-CM | POA: Insufficient documentation

## 2016-12-09 DIAGNOSIS — I517 Cardiomegaly: Secondary | ICD-10-CM | POA: Diagnosis not present

## 2016-12-09 DIAGNOSIS — Z01812 Encounter for preprocedural laboratory examination: Secondary | ICD-10-CM | POA: Diagnosis not present

## 2016-12-09 LAB — CBC
HCT: 39.9 % (ref 36.0–46.0)
Hemoglobin: 13.2 g/dL (ref 12.0–15.0)
MCH: 32.8 pg (ref 26.0–34.0)
MCHC: 33.1 g/dL (ref 30.0–36.0)
MCV: 99.3 fL (ref 78.0–100.0)
PLATELETS: 293 10*3/uL (ref 150–400)
RBC: 4.02 MIL/uL (ref 3.87–5.11)
RDW: 14.3 % (ref 11.5–15.5)
WBC: 10.6 10*3/uL — ABNORMAL HIGH (ref 4.0–10.5)

## 2016-12-09 LAB — BASIC METABOLIC PANEL
Anion gap: 8 (ref 5–15)
BUN: 21 mg/dL — ABNORMAL HIGH (ref 6–20)
CALCIUM: 9.3 mg/dL (ref 8.9–10.3)
CO2: 27 mmol/L (ref 22–32)
CREATININE: 0.75 mg/dL (ref 0.44–1.00)
Chloride: 102 mmol/L (ref 101–111)
GFR calc Af Amer: >60 mL/min (ref >60)
GFR calc non Af Amer: >60 mL/min (ref >60)
GLUCOSE: 93 mg/dL (ref 65–99)
Potassium: 4.6 mmol/L (ref 3.5–5.1)
Sodium: 137 mmol/L (ref 135–145)

## 2016-12-09 NOTE — Progress Notes (Signed)
EKG shown to Dr.Joslin in anesthesia regarding prolonged QT. No further orders.

## 2016-12-09 NOTE — Progress Notes (Addendum)
LOV?LABS 10/24/16 Dr. Babs Sciara chart CT chest 06/21/16 chart  4.2 cm AAA followed by [facility]  No change from CT 04/29/14

## 2016-12-12 NOTE — H&P (Signed)
CC: I have bladder cancer.  HPI: Beverly Francis is a 76 year-old female established patient who is here for bladder cancer.    Beverly Francis returns today in f/u for cystoscopy for her history of bladder cancer and CIS. 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 She completed BCG in 12/15. She had a positive cytology at the time of her last cystoscopy and a CT was done that was negative. Her cytology prior to this visit is still positive. She has had no hematuria. She has had some increased severity of her OAB wet and remains on Toviaz 8mg  which helps but doesn't eliminate her symptoms. She has frequency and urgency with UUI. She wears pads and has wet some in the last day. She has nocturia x 3. She has no SUI. She has no gross hematuria. She has no associated signs or symptoms.      ALLERGIES: OxyCODONE HCl TABS    MEDICATIONS: Aspirin 81 mg tablet, chewable 1 tablet PO Daily  Lisinopril 10 mg tablet 1 tablet PO Daily  Toviaz 8 mg tablet, extended release 24 hr 1 tablet PO Daily  Advair Diskus 1 PO Daily  Calcium + D 1 PO Daily  Ventolin Hfa     GU PSH: Cysto Bladder Ureth Biopsy - 2007-05-30 Cysto Uretero W/excise Tumor - 2006/05/30 Cystoscopy - 07/27/2016, 01/25/2016, 10/22/2015 Cystoscopy TURBT <2 cm - May 29, 2013 Locm 300-399Mg /Ml Iodine,1Ml - 08/19/2016      PSH Notes: Cataract Surgery, Cystoscopy With Fulguration Small Lesion (5-34mm), Lung Surgery, Cystoscopy Bladder Tumor Posterior Wall Bilaterally, Cystoscopy With Biopsy, Cystoscopy With Resection Of Tumor, Bladder Surgery   NON-GU PSH: Lung Surgery (Unspecified) - 05-29-2013    GU PMH: Microscopic hematuria - 10/22/2015 History of bladder cancer, History of malignant neoplasm of bladder - 05/30/15, Bladder Cancer, - May 29, 2012 Urge incontinence, Urge incontinence of urine - May 30, 2015 Other microscopic hematuria, Microscopic hematuria - 30-May-2014 CIS of the bladder, Carcinoma in situ of bladder - 05-30-2014 Chronic cystitis (w/o hematuria), Chronic cystitis -  2013/05/29 Abnormal urine findings, Cytological and Histological exam, Abnormal urine cytology - 05-29-2013    NON-GU PMH: Malignant neoplasm of unspecified part of left bronchus or lung, Non-small cell cancer of left lung - May 29, 2013 Solitary pulmonary nodule, Pulmonary nodule, left - May 29, 2013 Encounter for general adult medical examination without abnormal findings, Encounter for preventive health examination - 2013/05/29 Personal history of other diseases of the circulatory system, History of hypertension - May 29, 2012 Personal history of other diseases of the musculoskeletal system and connective tissue, History of osteopenia - May 29, 2012    FAMILY HISTORY: Death of family member - Runs In Family Family Health Status Number - Runs In Family   SOCIAL HISTORY: Marital Status: Divorced Preferred Language: English; Ethnicity: Not Hispanic Or Latino; Race: White Current Smoking Status: Patient does not smoke anymore.  Social Drinker.  Drinks 1 caffeinated drink per day. 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     Notes: Former smoker, Former tobacco use, Occupation:, Tobacco Use, Marital History - Divorced, Alcohol Use   REVIEW OF SYSTEMS:    GU Review Female:   Patient reports frequent urination, hard to postpone urination, get up at night to urinate, and leakage of urine. Patient denies burning /pain with urination, stream starts and stops, trouble starting your stream, have to strain to urinate, and being pregnant.  Gastrointestinal (Upper):   Patient denies nausea, vomiting, and indigestion/ heartburn.  Gastrointestinal (Lower):   Patient reports constipation. Patient denies diarrhea.  Constitutional:   Patient denies fever, night  sweats, weight loss, and fatigue.  Skin:   Patient denies skin rash/ lesion and itching.  Eyes:   Patient denies blurred vision and double vision.  Ears/ Nose/ Throat:   Patient denies sore throat and sinus problems.  Hematologic/Lymphatic:   Patient denies swollen glands and easy bruising.   Cardiovascular:   Patient denies leg swelling and chest pains.  Respiratory:   Patient denies cough and shortness of breath.  Endocrine:   Patient denies excessive thirst.  Musculoskeletal:   Patient denies back pain and joint pain.  Neurological:   Patient denies headaches and dizziness.  Psychologic:   Patient denies depression and anxiety.   VITAL SIGNS:      11/28/2016 02:34 PM  Weight 100 lb / 45.36 kg  Height 63.5 in / 161.29 cm  BP 110/75 mmHg  Pulse 113 /min  Temperature 97.4 F / 36.3 C  BMI 17.4 kg/m   MULTI-SYSTEM PHYSICAL EXAMINATION:    Constitutional: Thin. No physical deformities. Normally developed. Good grooming.   Respiratory: No labored breathing, no use of accessory muscles. bibasilar crackles  Cardiovascular: Normal temperature, RRR without murmur or edema.      PAST DATA REVIEWED:  Source Of History:  Patient  Lab Test Review:   Urine Cytology  Urine Test Review:   Urinalysis  X-Ray Review: C.T. Hematuria: Reviewed Films. Reviewed Report. Discussed With Patient.     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 she had a negative CT in 5/18. She needs cystoscopy with renal washings and retrogrades and probable bladder biopsies. I have reviewed the risks in detail and will get that set up.  2   Chronic cystitis (w/o hematuria) - N30.20 Urine culture today. She has some pyuria and  increased incontinence.   3   Urge incontinence - N39.41    PLAN:           Orders Labs Urine Culture          Schedule Return Visit/Planned Activity: Next Available Appointment - Schedule Surgery          Document Letter(s):  Created for Patient: Clinical Summary

## 2016-12-12 NOTE — Anesthesia Preprocedure Evaluation (Addendum)
Anesthesia Evaluation  Patient identified by MRN, date of birth, ID band Patient awake    Reviewed: Allergy & Precautions, H&P , NPO status , Patient's Chart, lab work & pertinent test results  Airway Mallampati: II  TM Distance: >3 FB Neck ROM: Full    Dental no notable dental hx. (+) Teeth Intact, Dental Advisory Given   Pulmonary shortness of breath and with exertion, COPD,  COPD inhaler, former smoker,    Pulmonary exam normal breath sounds clear to auscultation       Cardiovascular Exercise Tolerance: Good hypertension, Pt. on medications + Peripheral Vascular Disease   Rhythm:Regular Rate:Normal     Neuro/Psych Anxiety negative neurological ROS  negative psych ROS   GI/Hepatic negative GI ROS, Neg liver ROS,   Endo/Other  negative endocrine ROS  Renal/GU negative Renal ROS  negative genitourinary   Musculoskeletal  (+) Arthritis , Osteoarthritis,    Abdominal   Peds  Hematology negative hematology ROS (+)   Anesthesia Other Findings   Reproductive/Obstetrics negative OB ROS                            Anesthesia Physical Anesthesia Plan  ASA: III  Anesthesia Plan: General   Post-op Pain Management:    Induction: Intravenous  PONV Risk Score and Plan: 4 or greater and Ondansetron, Dexamethasone, Treatment may vary due to age or medical condition and Midazolam  Airway Management Planned: LMA  Additional Equipment:   Intra-op Plan:   Post-operative Plan: Extubation in OR  Informed Consent: I have reviewed the patients History and Physical, chart, labs and discussed the procedure including the risks, benefits and alternatives for the proposed anesthesia with the patient or authorized representative who has indicated his/her understanding and acceptance.   Dental advisory given  Plan Discussed with: CRNA  Anesthesia Plan Comments:        Anesthesia Quick  Evaluation

## 2016-12-13 ENCOUNTER — Ambulatory Visit (HOSPITAL_COMMUNITY): Payer: Medicare Other | Admitting: Anesthesiology

## 2016-12-13 ENCOUNTER — Encounter (HOSPITAL_COMMUNITY)
Admission: RE | Disposition: A | Discharge status: Home / Self Care | Payer: Self-pay | Source: Ambulatory Visit | Attending: Urology

## 2016-12-13 ENCOUNTER — Encounter (HOSPITAL_COMMUNITY): Payer: Self-pay | Admitting: Anesthesiology

## 2016-12-13 ENCOUNTER — Ambulatory Visit (HOSPITAL_COMMUNITY): Payer: Medicare Other

## 2016-12-13 ENCOUNTER — Ambulatory Visit (HOSPITAL_COMMUNITY)
Admission: RE | Admit: 2016-12-13 | Discharge: 2016-12-13 | Disposition: A | Discharge status: Home / Self Care | Payer: Medicare Other | Source: Ambulatory Visit | Attending: Urology | Admitting: Urology

## 2016-12-13 DIAGNOSIS — D0919 Carcinoma in situ of other urinary organs: Secondary | ICD-10-CM | POA: Diagnosis not present

## 2016-12-13 DIAGNOSIS — M1990 Unspecified osteoarthritis, unspecified site: Secondary | ICD-10-CM | POA: Insufficient documentation

## 2016-12-13 DIAGNOSIS — Z79899 Other long term (current) drug therapy: Secondary | ICD-10-CM | POA: Insufficient documentation

## 2016-12-13 DIAGNOSIS — Z8551 Personal history of malignant neoplasm of bladder: Secondary | ICD-10-CM | POA: Diagnosis not present

## 2016-12-13 DIAGNOSIS — C3490 Malignant neoplasm of unspecified part of unspecified bronchus or lung: Secondary | ICD-10-CM | POA: Diagnosis not present

## 2016-12-13 DIAGNOSIS — D090 Carcinoma in situ of bladder: Secondary | ICD-10-CM | POA: Diagnosis not present

## 2016-12-13 DIAGNOSIS — N135 Crossing vessel and stricture of ureter without hydronephrosis: Secondary | ICD-10-CM | POA: Diagnosis not present

## 2016-12-13 DIAGNOSIS — Z87891 Personal history of nicotine dependence: Secondary | ICD-10-CM | POA: Insufficient documentation

## 2016-12-13 DIAGNOSIS — J449 Chronic obstructive pulmonary disease, unspecified: Secondary | ICD-10-CM | POA: Insufficient documentation

## 2016-12-13 DIAGNOSIS — N3289 Other specified disorders of bladder: Secondary | ICD-10-CM | POA: Diagnosis not present

## 2016-12-13 DIAGNOSIS — I10 Essential (primary) hypertension: Secondary | ICD-10-CM | POA: Insufficient documentation

## 2016-12-13 DIAGNOSIS — D494 Neoplasm of unspecified behavior of bladder: Secondary | ICD-10-CM | POA: Diagnosis not present

## 2016-12-13 DIAGNOSIS — N365 Urethral false passage: Secondary | ICD-10-CM | POA: Diagnosis not present

## 2016-12-13 DIAGNOSIS — N3941 Urge incontinence: Secondary | ICD-10-CM | POA: Insufficient documentation

## 2016-12-13 DIAGNOSIS — C675 Malignant neoplasm of bladder neck: Secondary | ICD-10-CM | POA: Diagnosis not present

## 2016-12-13 DIAGNOSIS — F419 Anxiety disorder, unspecified: Secondary | ICD-10-CM | POA: Insufficient documentation

## 2016-12-13 DIAGNOSIS — Z7982 Long term (current) use of aspirin: Secondary | ICD-10-CM | POA: Insufficient documentation

## 2016-12-13 DIAGNOSIS — R828 Abnormal findings on cytological and histological examination of urine: Secondary | ICD-10-CM | POA: Diagnosis not present

## 2016-12-13 DIAGNOSIS — I739 Peripheral vascular disease, unspecified: Secondary | ICD-10-CM | POA: Insufficient documentation

## 2016-12-13 DIAGNOSIS — N3020 Other chronic cystitis without hematuria: Secondary | ICD-10-CM | POA: Insufficient documentation

## 2016-12-13 DIAGNOSIS — C679 Malignant neoplasm of bladder, unspecified: Secondary | ICD-10-CM | POA: Diagnosis present

## 2016-12-13 HISTORY — PX: CYSTOSCOPY W/ RETROGRADES: SHX1426

## 2016-12-13 HISTORY — PX: CYSTOSCOPY WITH BIOPSY: SHX5122

## 2016-12-13 SURGERY — CYSTOSCOPY, WITH RETROGRADE PYELOGRAM
Anesthesia: General

## 2016-12-13 MED ORDER — SODIUM CHLORIDE 0.9 % IR SOLN
Status: DC | PRN
  Administered 2016-12-13 (×2): 1000 mL

## 2016-12-13 MED ORDER — ONDANSETRON HCL 4 MG/2ML IJ SOLN
INTRAMUSCULAR | Status: DC | PRN
  Administered 2016-12-13: 4 mg via INTRAVENOUS

## 2016-12-13 MED ORDER — LACTATED RINGERS IV SOLN
INTRAVENOUS | Status: DC
  Administered 2016-12-13: 10:00:00 via INTRAVENOUS

## 2016-12-13 MED ORDER — LIDOCAINE 2% (20 MG/ML) 5 ML SYRINGE
INTRAMUSCULAR | Status: DC | PRN
  Administered 2016-12-13: 40 mg via INTRAVENOUS

## 2016-12-13 MED ORDER — CEFAZOLIN SODIUM-DEXTROSE 2-4 GM/100ML-% IV SOLN
INTRAVENOUS | Status: AC
  Filled 2016-12-13: qty 100

## 2016-12-13 MED ORDER — PROPOFOL 10 MG/ML IV BOLUS
INTRAVENOUS | Status: AC
  Filled 2016-12-13: qty 20

## 2016-12-13 MED ORDER — TRAMADOL HCL 50 MG PO TABS: 50.0000 mg | ORAL_TABLET | Freq: Four times a day (QID) | ORAL | 0 refills | Status: DC | PRN

## 2016-12-13 MED ORDER — PROPOFOL 10 MG/ML IV BOLUS
INTRAVENOUS | Status: DC | PRN
  Administered 2016-12-13: 100 mg via INTRAVENOUS

## 2016-12-13 MED ORDER — IOHEXOL 300 MG/ML  SOLN
INTRAMUSCULAR | Status: DC | PRN
  Administered 2016-12-13: 21 mL

## 2016-12-13 MED ORDER — LACTATED RINGERS IV SOLN
INTRAVENOUS | Status: DC | PRN
  Administered 2016-12-13: 07:00:00 via INTRAVENOUS

## 2016-12-13 MED ORDER — LIDOCAINE 2% (20 MG/ML) 5 ML SYRINGE
INTRAMUSCULAR | Status: AC
  Filled 2016-12-13: qty 5

## 2016-12-13 MED ORDER — FENTANYL CITRATE (PF) 100 MCG/2ML IJ SOLN: 25.0000 ug | INTRAMUSCULAR | Status: DC | PRN

## 2016-12-13 MED ORDER — FENTANYL CITRATE (PF) 100 MCG/2ML IJ SOLN
INTRAMUSCULAR | Status: AC
  Filled 2016-12-13: qty 2

## 2016-12-13 MED ORDER — FENTANYL CITRATE (PF) 100 MCG/2ML IJ SOLN
INTRAMUSCULAR | Status: DC | PRN
  Administered 2016-12-13 (×3): 25 ug via INTRAVENOUS

## 2016-12-13 MED ORDER — ONDANSETRON HCL 4 MG/2ML IJ SOLN
INTRAMUSCULAR | Status: AC
  Filled 2016-12-13: qty 2

## 2016-12-13 MED ORDER — CEFAZOLIN SODIUM-DEXTROSE 2-4 GM/100ML-% IV SOLN
2.0000 g | INTRAVENOUS | Status: AC
  Administered 2016-12-13: 2 g via INTRAVENOUS

## 2016-12-13 SURGICAL SUPPLY — 32 items
BAG URINE DRAINAGE (UROLOGICAL SUPPLIES) IMPLANT
BAG URO CATCHER STRL LF (MISCELLANEOUS) ×3 IMPLANT
BASKET STONE NCOMPASS (UROLOGICAL SUPPLIES) IMPLANT
CATH FOLEY 3WAY 30CC 22FR (CATHETERS) IMPLANT
CATH URET 5FR 28IN OPEN ENDED (CATHETERS) ×1 IMPLANT
CATH URET DUAL LUMEN 6-10FR 50 (CATHETERS) ×3 IMPLANT
CLOTH BEACON ORANGE TIMEOUT ST (SAFETY) ×3 IMPLANT
CONT SPECI 4OZ STER CLIK (MISCELLANEOUS) ×1 IMPLANT
COVER FOOTSWITCH UNIV (MISCELLANEOUS) IMPLANT
COVER SURGICAL LIGHT HANDLE (MISCELLANEOUS) ×3 IMPLANT
EXTRACTOR STONE NITINOL NGAGE (UROLOGICAL SUPPLIES) ×3 IMPLANT
FIBER LASER FLEXIVA 1000 (UROLOGICAL SUPPLIES) IMPLANT
FIBER LASER FLEXIVA 365 (UROLOGICAL SUPPLIES) IMPLANT
FIBER LASER FLEXIVA 550 (UROLOGICAL SUPPLIES) IMPLANT
FIBER LASER TRAC TIP (UROLOGICAL SUPPLIES) IMPLANT
GLOVE SURG SS PI 8.0 STRL IVOR (GLOVE) IMPLANT
GOWN STRL REUS W/TWL XL LVL3 (GOWN DISPOSABLE) ×3 IMPLANT
GUIDEWIRE STR DUAL SENSOR (WIRE) ×3 IMPLANT
HOLDER FOLEY CATH W/STRAP (MISCELLANEOUS) IMPLANT
IV NS 1000ML (IV SOLUTION) ×3
IV NS 1000ML BAXH (IV SOLUTION) ×2 IMPLANT
IV NS IRRIG 3000ML ARTHROMATIC (IV SOLUTION) ×3 IMPLANT
LOOP CUT BIPOLAR 24F LRG (ELECTROSURGICAL) IMPLANT
MANIFOLD NEPTUNE II (INSTRUMENTS) ×3 IMPLANT
PACK CYSTO (CUSTOM PROCEDURE TRAY) ×3 IMPLANT
SET ASPIRATION TUBING (TUBING) ×3 IMPLANT
SHEATH ACCESS URETERAL 38CM (SHEATH) ×3 IMPLANT
SUT ETHILON 3 0 PS 1 (SUTURE) IMPLANT
SYR 30ML LL (SYRINGE) IMPLANT
SYRINGE 10CC LL (SYRINGE) ×1 IMPLANT
SYRINGE IRR TOOMEY STRL 70CC (SYRINGE) IMPLANT
TUBING CONNECTING 10 (TUBING) ×3 IMPLANT

## 2016-12-13 NOTE — Op Note (Signed)
NAMEMarland Kitchen  Beverly Francis, Beverly Francis NO.:  [id number]  MEDICAL RECORD NO.:  [id number]  LOCATION:  PERIO                        FACILITY:  [facility]  PHYSICIAN:  Marshall Cork. Jeffie Pollock, M.D.    DATE OF BIRTH:  1940-06-11  DATE OF PROCEDURE:  12/13/2016 DATE OF DISCHARGE:                              OPERATIVE REPORT   PROCEDURE: 1. Cystoscopy with bilateral retrograde pyelograms with     interpretation. 2. Right renal washings. 3. Biopsy and fulguration of a 1 cm left bladder neck tumor.  A 5 mm     right lateral wall lesion and abnormal mucosa at the right bladder     neck. 4. Left ureteroscopy for insertion of left ureteral stent.  PREOPERATIVE DIAGNOSIS:  History of bladder cancer with positive cytology.  POSTOPERATIVE DIAGNOSIS:  Recurrent tumors in the left bladder neck and possibly the right lateral wall and right bladder neck with a left distal ureteral stricture.  SURGEON:  Marshall Cork. Jeffie Pollock, M.D.  ANESTHESIA:  General.  SPECIMEN:  Right renal washings and biopsies from the left bladder neck lesion, the right bladder neck, and the right lateral wall lesion.  DRAINS:  A 6-French 24 cm left double-J stent with tether.  BLOOD LOSS:  Minimal.  COMPLICATIONS:  Ureteral false passage.  INDICATIONS:  Beverly Francis is a 76 year old white female with a history of bladder cancer, who has had recent positive cytologies with negative cysto, and it was felt that cystoscopy with renal washings and retrograde pyelography with possible bladder biopsy was indicated.  FINDINGS AND PROCEDURE:  She was taken to the operating room where she was given Ancef.  A general anesthetic was induced.  She was placed in lithotomy position.  Her perineum and genitalia were prepped with Betadine solution.  She was draped in usual sterile fashion.  Cystoscopy was performed using a 23-French scope and 30-degree lens. Examination revealed a normal urethra.  The bladder wall had mild trabeculation.  There  was a stellate scar on the right lateral wall that had an erythematous lesion approximately 5 mm in size on the medial margin, it did have clear papillary features.  There was approximately 1 cm lesion on the left bladder neck that did have papillary features suspicious for carcinoma in situ or papillary carcinoma.  There were also some mild erythematous changes of the right bladder neck.  The right ureteral orifice was cannulated with 5-French open-end catheter, which was passed to the kidney.  The collecting system was irrigated with saline and approximately 10 mL of fluid was collected for cytologic analysis.  A left retrograde pyelogram was then performed with contrast.  This demonstrated normal internal collecting system and ureter.  I then attempted left ureteral catheterization, however, was unable to get the catheter beyond approximately 3 cm beyond the meatus.  At this point, a retrograde pyelogram was performed, which revealed some narrowing of the distal ureter suggestive of stricture, but the more proximal ureter and intrarenal collecting system were unremarkable.  At this point, the open-end catheter was removed and the 4.5-French semi- rigid ureteroscope was passed up the distal ureter.  Initially, I did not see a lumen, but eventually I saw a small flap at approximately  5 o'clock and realized that the ureteral catheter had false pass because of the stricture.  I was then able to negotiate a guidewire to the kidney and the ureteroscope was removed.  The cystoscope was reinserted and cup biopsies were obtained from the left bladder neck lesion, the right lateral wall lesion and the right bladder neck area.  Once the biopsies had been obtained, the Bugbee electrode was used to fulgurate the biopsy sites.  Once hemostasis was achieved in this area, the cystoscope was reinserted over the wire and a 6-French 24 cm Contour double-J stent with tether was passed to the  kidney under fluoroscopic guidance without difficulty. The wire was removed leaving good coil in the kidney and a good coil in the bladder.  The cystoscope was removed after the bladder was drained and the stent string was secured to the patient's inner thigh.  She was taken down from lithotomy position.  Her anesthetic was reversed.  She was moved to the recovery room in stable condition.  The procedure was complicated by the ureteral false passage.  She will require staying for approximately 3 days.     Marshall Cork. Jeffie Pollock, M.D.     JJW/MEDQ  D:  12/13/2016  T:  12/13/2016  Job:  [id number]

## 2016-12-13 NOTE — Brief Op Note (Signed)
12/13/2016  12:20 PM  PATIENT:  Beverly Francis  76 y.o. female  PRE-OPERATIVE DIAGNOSIS:  HISTORY OF BLADDER CANCER  POST-OPERATIVE DIAGNOSIS:  Bladder cancer of the bladder neck and right lateral wall.  Left distal ureteral stricture.   PROCEDURE:  Procedure(s): CYSTOSCOPY WITH RETROGRADE PYELOGRAM AND BILATERAL RENAL WASHINGS (Bilateral) CYSTOSCOPY WITH BIOPSY (N/A) and fulguration of a 1cm bladder tumor from the left bladder neck and 0.5cm lesion of the right lateral wall.   SURGEON:  Surgeon(s) and Role:    * Irine Seal, MD - Primary  PHYSICIAN ASSISTANT:   ASSISTANTS: none   ANESTHESIA:   general  EBL:  Total I/O In: 1010 [I.V.:1010] Out: 10 [Blood:10]  BLOOD ADMINISTERED:none  DRAINS: 6 x 24 left JJ stent with tether.   LOCAL MEDICATIONS USED:  NONE  SPECIMEN:  Source of Specimen:  biopsies from right lateral wall, right bladder neck and left bladder neck,  Washings from right renal pelvis.   DISPOSITION OF SPECIMEN:  PATHOLOGY  COUNTS:  YES  TOURNIQUET:  * No tourniquets in log *  DICTATION: .Other Dictation: Dictation Number [id number]  PLAN OF CARE: Discharge to home after PACU  PATIENT DISPOSITION:  PACU - hemodynamically stable.   Delay start of Pharmacological VTE agent (>24hrs) due to surgical blood loss or risk of bleeding: not applicable

## 2016-12-13 NOTE — Interval H&P Note (Signed)
History and Physical Interval Note:  12/13/2016 7:23 AM  [name]  has presented today for surgery, with the diagnosis of HISTORY OF BLADDER CANCER  The various methods of treatment have been discussed with the patient and family. After consideration of risks, benefits and other options for treatment, the patient has consented to  Procedure(s): CYSTOSCOPY WITH RETROGRADE PYELOGRAM AND BILATERAL RENAL WASHINGS (Bilateral) CYSTOSCOPY WITH BIOPSY (N/A) as a surgical intervention .  The patient's history has been reviewed, patient examined, no change in status, stable for surgery.  I have reviewed the patient's chart and labs.  Questions were answered to the patient's satisfaction.     Beverly Francis

## 2016-12-13 NOTE — Transfer of Care (Signed)
Immediate Anesthesia Transfer of Care Note  Patient: Beverly Francis  Procedure(s) Performed: Procedure(s): CYSTOSCOPY WITH RETROGRADE PYELOGRAM AND BILATERAL RENAL WASHINGS (Bilateral) CYSTOSCOPY WITH BIOPSY (N/A)  Patient Location: PACU  Anesthesia Type:General  Level of Consciousness:  sedated, patient cooperative and responds to stimulation  Airway & Oxygen Therapy:Patient Spontanous Breathing and Patient connected to face mask oxgen  Post-op Assessment:  Report given to PACU RN and Post -op Vital signs reviewed and stable  Post vital signs:  Reviewed and stable  Last Vitals:  Vitals:   12/13/16 0609  BP: 120/71  Pulse: 92  Resp: 16  Temp: 36.9 C  SpO2: 20%    Complications: No apparent anesthesia complications

## 2016-12-13 NOTE — Discharge Instructions (Addendum)
Ureteral Stent Implantation, Care After Refer to this sheet in the next few weeks. These instructions provide you with information about caring for yourself after your procedure. Your health care provider may also give you more specific instructions. Your treatment has been planned according to current medical practices, but problems sometimes occur. Call your health care provider if you have any problems or questions after your procedure. What can I expect after the procedure? After the procedure, it is common to have:  Nausea.  Mild pain when you urinate. You may feel this pain in your lower back or lower abdomen. Pain should stop within a few minutes after you urinate. This may last for up to 1 week.  A small amount of blood in your urine for several days.  Follow these instructions at home:  Medicines  Take over-the-counter and prescription medicines only as told by your health care provider.  If you were prescribed an antibiotic medicine, take it as told by your health care provider. Do not stop taking the antibiotic even if you start to feel better.  Do not drive for 24 hours if you received a sedative.  Do not drive or operate heavy machinery while taking prescription pain medicines. Activity  Return to your normal activities as told by your health care provider. Ask your health care provider what activities are safe for you.  Do not lift anything that is heavier than 10 lb (4.5 kg). Follow this limit for 1 week after your procedure, or for as long as told by your health care provider. General instructions  Watch for any blood in your urine. Call your health care provider if the amount of blood in your urine increases.  If you have a catheter: ? Follow instructions from your health care provider about taking care of your catheter and collection bag. ? Do not take baths, swim, or use a hot tub until your health care provider approves.  Drink enough fluid to keep your urine  clear or pale yellow.  Keep all follow-up visits as told by your health care provider. This is important. Contact a health care provider if:  You have pain that gets worse or does not get better with medicine, especially pain when you urinate.  You have difficulty urinating.  You feel nauseous or you vomit repeatedly during a period of more than 2 days after the procedure. Get help right away if:  Your urine is dark red or has blood clots in it.  You are leaking urine (have incontinence).  The end of the stent comes out of your urethra.  You cannot urinate.  You have sudden, sharp, or severe pain in your abdomen or lower back.  You have a fever.  You may remove the stent by pulling the attached string on Friday morning.  If you don't feel comfortable doing that you can come to the office.  This information is not intended to replace advice given to you by your health care provider. Make sure you discuss any questions you have with your health care provider. Document Released: 10/24/2012 Document Revised: 07/30/2015 Document Reviewed: 09/05/2014 Elsevier Interactive Patient Education  2018 [place] Anesthesia, Adult, Care After These instructions provide you with information about caring for yourself after your procedure. Your health care provider may also give you more specific instructions. Your treatment has been planned according to current medical practices, but problems sometimes occur. Call your health care provider if you have any problems or questions after your  procedure. What can I expect after the procedure? After the procedure, it is common to have:  Vomiting.  A sore throat.  Mental slowness.  It is common to feel:  Nauseous.  Cold or shivery.  Sleepy.  Tired.  Sore or achy, even in parts of your body where you did not have surgery.  Follow these instructions at home: For at least 24 hours after the procedure:  Do  not: ? Participate in activities where you could fall or become injured. ? Drive. ? Use heavy machinery. ? Drink alcohol. ? Take sleeping pills or medicines that cause drowsiness. ? Make important decisions or sign legal documents. ? Take care of children on your own.  Rest. Eating and drinking  If you vomit, drink water, juice, or soup when you can drink without vomiting.  Drink enough fluid to keep your urine clear or pale yellow.  Make sure you have little or no nausea before eating solid foods.  Follow the diet recommended by your health care provider. General instructions  Have a responsible adult stay with you until you are awake and alert.  Return to your normal activities as told by your health care provider. Ask your health care provider what activities are safe for you.  Take over-the-counter and prescription medicines only as told by your health care provider.  If you smoke, do not smoke without supervision.  Keep all follow-up visits as told by your health care provider. This is important. Contact a health care provider if:  You continue to have nausea or vomiting at home, and medicines are not helpful.  You cannot drink fluids or start eating again.  You cannot urinate after 8-12 hours.  You develop a skin rash.  You have fever.  You have increasing redness at the site of your procedure. Get help right away if:  You have difficulty breathing.  You have chest pain.  You have unexpected bleeding.  You feel that you are having a life-threatening or urgent problem. This information is not intended to replace advice given to you by your health care provider. Make sure you discuss any questions you have with your health care provider. Document Released: 05/30/2000 Document Revised: 07/27/2015 Document Reviewed: 02/05/2015 Elsevier Interactive Patient Education  [employer].

## 2016-12-13 NOTE — Anesthesia Procedure Notes (Signed)
Procedure Name: LMA Insertion Date/Time: 12/13/2016 7:38 AM Performed by: Anne Fu Pre-anesthesia Checklist: Patient identified, Emergency Drugs available, Suction available, Patient being monitored and Timeout performed Patient Re-evaluated:Patient Re-evaluated prior to induction Oxygen Delivery Method: Circle system utilized Preoxygenation: Pre-oxygenation with 100% oxygen Induction Type: IV induction Ventilation: Mask ventilation without difficulty LMA: LMA inserted LMA Size: 3.0 Number of attempts: 1 Placement Confirmation: positive ETCO2 and breath sounds checked- equal and bilateral Tube secured with: Tape

## 2016-12-13 NOTE — Anesthesia Postprocedure Evaluation (Signed)
Anesthesia Post Note  Patient: Beverly Francis  Procedure(s) Performed: CYSTOSCOPY WITH RETROGRADE PYELOGRAM AND BILATERAL RENAL WASHINGS (Bilateral ) CYSTOSCOPY WITH BIOPSY (N/A )     Patient location during evaluation: PACU Anesthesia Type: General Level of consciousness: awake and alert Pain management: pain level controlled Vital Signs Assessment: post-procedure vital signs reviewed and stable Respiratory status: spontaneous breathing, nonlabored ventilation and respiratory function stable Cardiovascular status: blood pressure returned to baseline and stable Postop Assessment: no apparent nausea or vomiting Anesthetic complications: no    Last Vitals:  Vitals:   12/13/16 0946 12/13/16 1016  BP: 136/71 (!) 140/92  Pulse: 80 100  Resp: 16 16  Temp: 36.6 C 36.4 C  SpO2: 98% 97%    Last Pain:  Vitals:   12/13/16 1016  TempSrc: Oral  PainSc:                  Lum Stillinger,W. EDMOND

## 2016-12-27 ENCOUNTER — Ambulatory Visit
Admission: RE | Admit: 2016-12-27 | Discharge: 2016-12-27 | Disposition: A | Discharge status: Home / Self Care | Payer: Medicare Other | Source: Ambulatory Visit | Attending: Thoracic Surgery (Cardiothoracic Vascular Surgery) | Admitting: Thoracic Surgery (Cardiothoracic Vascular Surgery)

## 2016-12-27 ENCOUNTER — Ambulatory Visit (INDEPENDENT_AMBULATORY_CARE_PROVIDER_SITE_OTHER): Payer: Medicare Other | Admitting: Thoracic Surgery (Cardiothoracic Vascular Surgery)

## 2016-12-27 VITALS — BP 106/73 | HR 89 | Ht 63.0 in | Wt 100.0 lb

## 2016-12-27 DIAGNOSIS — I7121 Aneurysm of the ascending aorta, without rupture: Secondary | ICD-10-CM

## 2016-12-27 DIAGNOSIS — R911 Solitary pulmonary nodule: Secondary | ICD-10-CM | POA: Diagnosis not present

## 2016-12-27 DIAGNOSIS — I712 Thoracic aortic aneurysm, without rupture: Secondary | ICD-10-CM

## 2016-12-27 DIAGNOSIS — R918 Other nonspecific abnormal finding of lung field: Secondary | ICD-10-CM

## 2016-12-27 DIAGNOSIS — R0602 Shortness of breath: Secondary | ICD-10-CM

## 2016-12-27 NOTE — Progress Notes (Signed)
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 She quit smoking in 2015. I did a left lower lobe superior segmentectomy for a stage IA non small cell carcinoma in 11/2013.   I last saw her in April of this year. Her lung nodules and aneurysm were unchanged.  She recently had a bladder procedure by Dr. Jeffie Pollock. She had some blood in her urine afterwards. She denies any chest pain, pressure, or tightness. She gets short of breath with heavy exertion but does well on basis. Her weight is stable. She does still have some discomfort related to her incision when she wears a bra.  Past Medical History:  Diagnosis Date  . Anxiety    takes Xanax daily as needed  . Arthritis   . Bladder cancer ([place])   . Cataracts, bilateral   . COPD (chronic obstructive pulmonary disease) (HCC)    Advair daily and Ventolin as needed  . History of colon polyps   . Hypertension    takes Lisinopril daily  . Lung mass    left  . Nocturia   . Osteoporosis   . Shortness of breath    with exertion  . Urinary frequency     Current Outpatient Prescriptions  Medication Sig Dispense Refill  . acetaminophen (TYLENOL) 650 MG CR tablet Take 1,300 mg by mouth 2 (two) times daily.    Marland Kitchen ADVAIR DISKUS 250-50 MCG/DOSE AEPB Inhale 1 puff into the lungs 2 (two) times daily.     Marland Kitchen aspirin EC 81 MG tablet Take 81 mg by mouth daily.    . Calcium 600-200 MG-UNIT per tablet Take 1 tablet by mouth daily.    Marland Kitchen EPIPEN 2-PAK 0.3 MG/0.3ML SOAJ injection Inject 0.3 mg into the muscle once.     Marland Kitchen lisinopril-hydrochlorothiazide (PRINZIDE,ZESTORETIC) 10-12.5 MG per tablet Take 1 tablet by mouth daily.   2  . VENTOLIN HFA 108 (90 Base) MCG/ACT inhaler Inhale 2 puffs into the lungs every 6 (six) hours as needed for wheezing or shortness of breath.   2    No current facility-administered medications for this visit.     Physical Exam BP 106/73   Pulse 89   Ht 5\' 3"  (1.6 m)   Wt 100 lb (45.4 kg)   SpO2 99%   BMI 17.71 kg/m  BP 106/73   Pulse 89   Ht 5\' 3"  (1.6 m)   Wt 100 lb (45.4 kg)   SpO2 99%   BMI 17.71 kg/m  Thin 76 year old woman in no acute distress Alert and oriented 3 with no focal deficits No cervical or supraclavicular adenopathy Cardiac regular rd rhythm normal S1 and S2 Lungs with diminished breath sounds bilaterally no rales or wheezing   Diagnostic Tests: CT CHEST WITHOUT CONTRAST  TECHNIQUE: Multidetector CT imaging of the chest was performed following the standard protocol without IV contrast.  COMPARISON:  Chest CT 06/21/2016 and 12/22/2015.  FINDINGS: Cardiovascular: Again demonstrated is diffuse atherosclerosis of the aorta, great vessels and coronary arteries. There is similar dilatation of the ascending aorta to approximately 4.1 cm. No acute vascular findings are evident on noncontrast imaging. The heart size is normal. There is no pericardial effusion.  Mediastinum/Nodes:  There are no enlarged mediastinal, hilar or axillary lymph nodes. The thyroid gland, trachea and esophagus demonstrate no significant findings.  Lungs/Pleura: There is no pleural effusion. There is stable emphysema with biapical scarring and diffuse central airway thickening. Postsurgical changes are present in the left lower lobe. The 6 mm left apical nodule on image 23 is stable. 3 mm right upper lobe nodule on image 31 is stable. No new or enlarging pulmonary nodules.  Upper abdomen: The visualized upper abdomen appears stable without suspicious findings. There is diffuse aortic and branch vessel atherosclerosis. There is a 2.4 cm cyst in the interpolar region of the right kidney.  Musculoskeletal/Chest wall: There is no chest wall mass or suspicious osseous finding. Chronic L3 compression deformity  again noted.  IMPRESSION: 1. Stable postoperative chest CT. No evidence of local recurrence or metastatic disease. 2. Stable small pulmonary nodules bilaterally. The left upper lobe lesion is unchanged from 05/19/2015, likely benign. 3. Stable incidental findings including atherosclerosis, mild dilatation of the ascending aorta and mild emphysema.   Electronically Signed   By: Richardean Sale M.D.   On: 12/27/2016 14:58  I personally reviewed the CT images. I concur with the findings noted above.  Impression: Beverly Francis is a 76 year old woman who had a left lower lobe superior segmentectomy for stage IA non-small cell carcinoma 3 years ago. She has no evidence recurrent disease.  Pulmonary nodules- most notable in the apex of the left lung. This is stable over time and likely benign. She does need continued follow-up, but I think we are safe to just check it again in a year.  Ascending aneurysm- stable at 4.2 cm. Needs continued annual follow-up.  Plan:  Return in one year with CT of chest  Melrose Nakayama, MD [facility] [phone]

## 2017-01-02 DIAGNOSIS — D090 Carcinoma in situ of bladder: Secondary | ICD-10-CM | POA: Diagnosis not present

## 2017-01-02 DIAGNOSIS — C678 Malignant neoplasm of overlapping sites of bladder: Secondary | ICD-10-CM | POA: Diagnosis not present

## 2017-01-02 DIAGNOSIS — N3941 Urge incontinence: Secondary | ICD-10-CM | POA: Diagnosis not present

## 2017-01-02 DIAGNOSIS — N3020 Other chronic cystitis without hematuria: Secondary | ICD-10-CM | POA: Diagnosis not present

## 2017-01-10 DIAGNOSIS — N390 Urinary tract infection, site not specified: Secondary | ICD-10-CM | POA: Diagnosis not present

## 2017-01-10 DIAGNOSIS — R300 Dysuria: Secondary | ICD-10-CM | POA: Diagnosis not present

## 2017-01-10 DIAGNOSIS — B9620 Unspecified Escherichia coli [E. coli] as the cause of diseases classified elsewhere: Secondary | ICD-10-CM | POA: Diagnosis not present

## 2017-01-24 DIAGNOSIS — C678 Malignant neoplasm of overlapping sites of bladder: Secondary | ICD-10-CM | POA: Diagnosis not present

## 2017-02-07 DIAGNOSIS — Z5111 Encounter for antineoplastic chemotherapy: Secondary | ICD-10-CM | POA: Diagnosis not present

## 2017-02-07 DIAGNOSIS — C678 Malignant neoplasm of overlapping sites of bladder: Secondary | ICD-10-CM | POA: Diagnosis not present

## 2017-02-21 DIAGNOSIS — Z5111 Encounter for antineoplastic chemotherapy: Secondary | ICD-10-CM | POA: Diagnosis not present

## 2017-02-21 DIAGNOSIS — C678 Malignant neoplasm of overlapping sites of bladder: Secondary | ICD-10-CM | POA: Diagnosis not present

## 2017-02-21 DIAGNOSIS — R3121 Asymptomatic microscopic hematuria: Secondary | ICD-10-CM | POA: Diagnosis not present

## 2017-03-08 DIAGNOSIS — Z5111 Encounter for antineoplastic chemotherapy: Secondary | ICD-10-CM | POA: Diagnosis not present

## 2017-03-08 DIAGNOSIS — C678 Malignant neoplasm of overlapping sites of bladder: Secondary | ICD-10-CM | POA: Diagnosis not present

## 2017-03-14 DIAGNOSIS — C678 Malignant neoplasm of overlapping sites of bladder: Secondary | ICD-10-CM | POA: Diagnosis not present

## 2017-03-14 DIAGNOSIS — Z5111 Encounter for antineoplastic chemotherapy: Secondary | ICD-10-CM | POA: Diagnosis not present

## 2017-03-22 DIAGNOSIS — Z5111 Encounter for antineoplastic chemotherapy: Secondary | ICD-10-CM | POA: Diagnosis not present

## 2017-03-22 DIAGNOSIS — C678 Malignant neoplasm of overlapping sites of bladder: Secondary | ICD-10-CM | POA: Diagnosis not present

## 2017-03-29 DIAGNOSIS — Z5111 Encounter for antineoplastic chemotherapy: Secondary | ICD-10-CM | POA: Diagnosis not present

## 2017-03-29 DIAGNOSIS — C678 Malignant neoplasm of overlapping sites of bladder: Secondary | ICD-10-CM | POA: Diagnosis not present

## 2017-05-03 DIAGNOSIS — Z8551 Personal history of malignant neoplasm of bladder: Secondary | ICD-10-CM | POA: Diagnosis not present

## 2017-05-03 DIAGNOSIS — N3020 Other chronic cystitis without hematuria: Secondary | ICD-10-CM | POA: Diagnosis not present

## 2017-05-03 DIAGNOSIS — R350 Frequency of micturition: Secondary | ICD-10-CM | POA: Diagnosis not present

## 2017-08-02 DIAGNOSIS — D090 Carcinoma in situ of bladder: Secondary | ICD-10-CM | POA: Diagnosis not present

## 2017-08-08 ENCOUNTER — Other Ambulatory Visit: Payer: Self-pay | Admitting: Urology

## 2017-08-16 NOTE — Patient Instructions (Signed)
Beverly Francis  08/16/2017   Your procedure is scheduled on: 08-22-17    Report to [facility] Main  Entrance    Report to admitting at 1015AM    Call this number if you have problems the morning of surgery [phone]     Remember: Do not eat food or drink liquids :After Midnight.     Take these medicines the morning of surgery with A SIP OF WATER: Advair inhaler if needed , ventolin inhaler if needed (please bring)                                 You may not have any metal on your body including hair pins and              piercings  Do not wear jewelry, make-up, lotions, powders or perfumes, deodorant             Do not wear nail polish.  Do not shave  48 hours prior to surgery.     Do not bring valuables to the hospital. [place].  Contacts, dentures or bridgework may not be worn into surgery.      Patients discharged the day of surgery will not be allowed to drive home.  Name and phone number of your driver:  Special Instructions: N/A              Please read over the following fact sheets you were given: _____________________________________________________________________             [facility] - Preparing for Surgery Before surgery, you can play an important role.  Because skin is not sterile, your skin needs to be as free of germs as possible.  You can reduce the number of germs on your skin by washing with CHG (chlorahexidine gluconate) soap before surgery.  CHG is an antiseptic cleaner which kills germs and bonds with the skin to continue killing germs even after washing. Please DO NOT use if you have an allergy to CHG or antibacterial soaps.  If your skin becomes reddened/irritated stop using the CHG and inform your nurse when you arrive at Short Stay. Do not shave (including legs and underarms) for at least 48 hours prior to the first CHG shower.  You may shave your face/neck. Please  follow these instructions carefully:  1.  Shower with CHG Soap the night before surgery and the  morning of Surgery.  2.  If you choose to wash your hair, wash your hair first as usual with your  normal  shampoo.  3.  After you shampoo, rinse your hair and body thoroughly to remove the  shampoo.                           4.  Use CHG as you would any other liquid soap.  You can apply chg directly  to the skin and wash                       Gently with a scrungie or clean washcloth.  5.  Apply the CHG Soap to your body ONLY FROM THE NECK DOWN.   Do not use on face/  open                           Wound or open sores. Avoid contact with eyes, ears mouth and genitals (private parts).                       Wash face,  Genitals (private parts) with your normal soap.             6.  Wash thoroughly, paying special attention to the area where your surgery  will be performed.  7.  Thoroughly rinse your body with warm water from the neck down.  8.  DO NOT shower/wash with your normal soap after using and rinsing off  the CHG Soap.                9.  Pat yourself dry with a clean towel.            10.  Wear clean pajamas.            11.  Place clean sheets on your bed the night of your first shower and do not  sleep with pets. Day of Surgery : Do not apply any lotions/deodorants the morning of surgery.  Please wear clean clothes to the hospital/surgery center.  FAILURE TO FOLLOW THESE INSTRUCTIONS MAY RESULT IN THE CANCELLATION OF YOUR SURGERY PATIENT SIGNATURE_________________________________  NURSE SIGNATURE__________________________________  ________________________________________________________________________

## 2017-08-16 NOTE — Progress Notes (Signed)
[facility] cardio-thoracic Dr Roxan Hockey 12-27-16 epic   CT Chest 12-27-16 epic   EKG 12-09-16 epic

## 2017-08-17 ENCOUNTER — Encounter (HOSPITAL_COMMUNITY): Payer: Self-pay

## 2017-08-17 ENCOUNTER — Other Ambulatory Visit: Payer: Self-pay

## 2017-08-17 ENCOUNTER — Encounter (HOSPITAL_COMMUNITY)
Admission: RE | Admit: 2017-08-17 | Discharge: 2017-08-17 | Disposition: A | Discharge status: Home / Self Care | Payer: Medicare Other | Source: Ambulatory Visit | Attending: Urology | Admitting: Urology

## 2017-08-17 DIAGNOSIS — Z8551 Personal history of malignant neoplasm of bladder: Secondary | ICD-10-CM | POA: Diagnosis not present

## 2017-08-17 DIAGNOSIS — Z01818 Encounter for other preprocedural examination: Secondary | ICD-10-CM | POA: Insufficient documentation

## 2017-08-17 HISTORY — DX: Thoracic aortic aneurysm, without rupture: I71.2

## 2017-08-17 HISTORY — DX: Malignant neoplasm of unspecified part of unspecified bronchus or lung: C34.90

## 2017-08-17 HISTORY — DX: Aneurysm of the ascending aorta, without rupture: I71.21

## 2017-08-17 LAB — CBC
HEMATOCRIT: 40.3 % (ref 36.0–46.0)
Hemoglobin: 13.3 g/dL (ref 12.0–15.0)
MCH: 32.3 pg (ref 26.0–34.0)
MCHC: 33 g/dL (ref 30.0–36.0)
MCV: 97.8 fL (ref 78.0–100.0)
PLATELETS: 300 10*3/uL (ref 150–400)
RBC: 4.12 MIL/uL (ref 3.87–5.11)
RDW: 13.5 % (ref 11.5–15.5)
WBC: 9 10*3/uL (ref 4.0–10.5)

## 2017-08-17 LAB — BASIC METABOLIC PANEL
Anion gap: 6 (ref 5–15)
BUN: 27 mg/dL — ABNORMAL HIGH (ref 6–20)
CALCIUM: 9.2 mg/dL (ref 8.9–10.3)
CO2: 31 mmol/L (ref 22–32)
Chloride: 103 mmol/L (ref 101–111)
Creatinine, Ser: 0.72 mg/dL (ref 0.44–1.00)
Glucose, Bld: 105 mg/dL — ABNORMAL HIGH (ref 65–99)
Potassium: 4.5 mmol/L (ref 3.5–5.1)
SODIUM: 140 mmol/L (ref 135–145)

## 2017-08-17 NOTE — Pre-Procedure Instructions (Signed)
BMP results 08/17/2017 faxed to Dr. Jeffie Pollock via epic.  Dr. Gifford Shave made aware no new orders received at this time.

## 2017-08-17 NOTE — Pre-Procedure Instructions (Signed)
Dr. Gifford Shave reviewed previous EKGs 11/2013 and 12/2016, no further EKG's required for this procedure on 08/22/2017.  Dr. Gifford Shave also reviewed CT of ascending aortic aneurysm no new orders received at this time. Beverly Francis is cleared for surgery on 08/22/2017 per Dr. Gifford Shave, anesthesia.

## 2017-08-21 NOTE — H&P (Signed)
CC: I have bladder cancer.  HPI: Beverly Francis is a 77 year-old female established patient who is here for bladder cancer.  Her problem was diagnosed 12/13/2016.   Her bladder cancer was treated by removal with scope. Patient denies removal of the entire bladder, radiation, and chemotherapy.   Beverly Francis returns today in f/u for her history of bladder cancer and CIS. She completed BCG about 4 months ago and had a small area of concern on cystoscopy 3 months ago but the cytology was negative. She returns today for another look. She has no hematuria or new voiding complaints but has persistent urinary frequency that is bothersome. She has a history of UUI.   She had a TURBT on 12/13/16 for her history of bladder cancer and CIS. 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 Renal washings were negative. 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 She completed BCG in 12/15.   She has OAB wet but stopped Toviaz 8mg  because she was in the donut hole. She found it didn't help her that much after all. She has frequency and urgency with UUI. She wears pads and has wet some in the last day. She has nocturia x 3. She has no SUI. She has no associated signs or symptoms.     ALLERGIES: OxyCODONE HCl TABS    MEDICATIONS: Aspirin 81 mg tablet,chewable 1 tablet PO Daily  Lisinopril 10 mg tablet 1 tablet PO Daily  Advair Diskus 1 PO Daily  Calcium + D 1 PO Daily  Ventolin Hfa     GU PSH: Bladder Instill AntiCA Agent - 03/29/2017, 03/22/2017, 03/14/2017, 03/08/2017, 02/21/2017, 02/07/2017 Cysto Bladder Ureth Biopsy - 2007/06/05 Cysto Fulgurate < 0.5 cm - 12/13/2016 Cysto Uretero W/excise Tumor - Jun 05, 2006 Cystoscopy - 05/03/2017, 07/27/2016, 01/25/2016, 10/22/2015 Cystoscopy Insert Stent, Left - 12/13/2016 Cystoscopy TURBT <2 cm - 12/13/2016, 2013-06-04 Cystoscopy Ureteroscopy, Left - 12/13/2016 Locm 300-399Mg /Ml Iodine,1Ml - 08/19/2016      PSH Notes: Cataract Surgery, Cystoscopy With  Fulguration Small Lesion (5-38mm), Lung Surgery, Cystoscopy Bladder Tumor Posterior Wall Bilaterally, Cystoscopy With Biopsy, Cystoscopy With Resection Of Tumor, Bladder Surgery   NON-GU PSH: Lung Surgery (Unspecified) - June 04, 2013    GU PMH: Urinary Frequency (Worsening), She is going to try Azo. - 05/03/2017 Bladder Cancer overlapping sites (Worsening), She has recurrent HGNMIBC and CIS and will need to be retreated with BCG. I will have her start that in a week and will have her return for a cystoscopy and cytology in 4 months. - 01/02/2017 Microscopic hematuria - 10/22/2015 History of bladder cancer, History of malignant neoplasm of bladder - 2015-06-05, Bladder Cancer, - 2012-06-04 Urge incontinence, Urge incontinence of urine - June 05, 2015 Other microscopic hematuria, Microscopic hematuria - Jun 05, 2014 CIS of the bladder, Carcinoma in situ of bladder - 06-05-2014 Chronic cystitis (w/o hematuria), Chronic cystitis - 04-Jun-2013 Abnormal urine findings, Cytological and Histological exam, Abnormal urine cytology - 06/04/13    NON-GU PMH: Malignant neoplasm of unspecified part of left bronchus or lung, Non-small cell cancer of left lung - 06-04-2013 Solitary pulmonary nodule, Pulmonary nodule, left - 06/04/2013 Encounter for general adult medical examination without abnormal findings, Encounter for preventive health examination - 06/04/13 Personal history of other diseases of the circulatory system, History of hypertension - 04-Jun-2012 Personal history of other diseases of the musculoskeletal system and connective tissue, History of osteopenia - June 04, 2012    FAMILY HISTORY: Death of family member - [facility]  Status Number - Runs In Family   SOCIAL HISTORY: Marital Status: Divorced Preferred Language: English; Ethnicity: Not Hispanic Or Latino; Race: White Current Smoking Status: Patient does not smoke anymore.  Social Drinker.  Drinks 1 caffeinated drink per day. 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     Notes: Former smoker, Former  tobacco use, Occupation:, Tobacco Use, Marital History - Divorced, Alcohol Use   REVIEW OF SYSTEMS:    GU Review Female:   Patient reports frequent urination, hard to postpone urination, and leakage of urine. Patient denies burning /pain with urination, get up at night to urinate, stream starts and stops, trouble starting your stream, have to strain to urinate, and being pregnant.  Gastrointestinal (Upper):   Patient denies nausea, vomiting, and indigestion/ heartburn.  Gastrointestinal (Lower):   Patient denies diarrhea and constipation.  Constitutional:   Patient denies fever, night sweats, weight loss, and fatigue.  Skin:   Patient denies skin rash/ lesion and itching.  Eyes:   Patient denies double vision and blurred vision.  Ears/ Nose/ Throat:   Patient denies sore throat and sinus problems.  Hematologic/Lymphatic:   Patient denies swollen glands and easy bruising.  Cardiovascular:   Patient denies leg swelling and chest pains.  Respiratory:   Patient denies cough and shortness of breath.  Endocrine:   Patient denies excessive thirst.  Musculoskeletal:   Patient denies back pain and joint pain.  Neurological:   Patient denies headaches and dizziness.  Psychologic:   Patient denies depression and anxiety.   VITAL SIGNS:      08/02/2017 12:00 PM  Weight 100 lb / 45.36 kg  Height 63 in / 160.02 cm  BP 108/63 mmHg  Heart Rate 89 /min  Temperature 97.9 F / 36.6 C  BMI 17.7 kg/m   PAST DATA REVIEWED:  Source Of History:  Patient  Lab Test Review:   Urine Cytology  Urine Test Review:   Urinalysis   PROCEDURES:          Urinalysis w/Scope Dipstick Dipstick Cont'd Micro  Color: Yellow Bilirubin: Neg WBC/hpf: NS (Not Seen)  Appearance: Clear Ketones: Neg RBC/hpf: 3 - 10/hpf  Specific Gravity: 1.025 Blood: 1+ Bacteria: Rare (0-9/hpf)  pH: 5.5 Protein: Neg Cystals: NS (Not Seen)  Glucose: Neg Urobilinogen: 0.2 Casts: NS (Not Seen)    Nitrites: Neg Trichomonas: Not Present     Leukocyte Esterase: Neg Mucous: Not Present      Epithelial Cells: 6 - 10/hpf      Yeast: NS (Not Seen)      Sperm: Not Present    ASSESSMENT:      ICD-10 Details  1 GU:   History of bladder cancer - Z85.51 She has an area on the right posterior wall that is about 1x1.5cm that is raised an erythematous that needs biopsy. I will get her set up for cystoscopy with cystview and bluelight assisted biopsy. I have reviewed the risks in detail.   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   I will do standard cysto and biopsy.

## 2017-08-22 ENCOUNTER — Encounter (HOSPITAL_COMMUNITY): Payer: Self-pay

## 2017-08-22 ENCOUNTER — Ambulatory Visit (HOSPITAL_COMMUNITY): Payer: Medicare Other | Admitting: Anesthesiology

## 2017-08-22 ENCOUNTER — Encounter (HOSPITAL_COMMUNITY)
Admission: RE | Disposition: A | Discharge status: Home / Self Care | Payer: Self-pay | Source: Ambulatory Visit | Attending: Urology

## 2017-08-22 ENCOUNTER — Ambulatory Visit (HOSPITAL_COMMUNITY)
Admission: RE | Admit: 2017-08-22 | Discharge: 2017-08-22 | Disposition: A | Discharge status: Home / Self Care | Payer: Medicare Other | Source: Ambulatory Visit | Attending: Urology | Admitting: Urology

## 2017-08-22 DIAGNOSIS — Z85118 Personal history of other malignant neoplasm of bronchus and lung: Secondary | ICD-10-CM | POA: Insufficient documentation

## 2017-08-22 DIAGNOSIS — Z7982 Long term (current) use of aspirin: Secondary | ICD-10-CM | POA: Diagnosis not present

## 2017-08-22 DIAGNOSIS — Z79899 Other long term (current) drug therapy: Secondary | ICD-10-CM | POA: Diagnosis not present

## 2017-08-22 DIAGNOSIS — Z87891 Personal history of nicotine dependence: Secondary | ICD-10-CM | POA: Diagnosis not present

## 2017-08-22 DIAGNOSIS — Z7951 Long term (current) use of inhaled steroids: Secondary | ICD-10-CM | POA: Insufficient documentation

## 2017-08-22 DIAGNOSIS — Z8551 Personal history of malignant neoplasm of bladder: Secondary | ICD-10-CM | POA: Diagnosis not present

## 2017-08-22 DIAGNOSIS — I10 Essential (primary) hypertension: Secondary | ICD-10-CM | POA: Diagnosis not present

## 2017-08-22 DIAGNOSIS — N3030 Trigonitis without hematuria: Secondary | ICD-10-CM | POA: Insufficient documentation

## 2017-08-22 DIAGNOSIS — N329 Bladder disorder, unspecified: Secondary | ICD-10-CM | POA: Diagnosis not present

## 2017-08-22 DIAGNOSIS — N3289 Other specified disorders of bladder: Secondary | ICD-10-CM | POA: Diagnosis not present

## 2017-08-22 HISTORY — PX: CYSTOSCOPY WITH BIOPSY: SHX5122

## 2017-08-22 SURGERY — CYSTOSCOPY, WITH BIOPSY
Anesthesia: General

## 2017-08-22 MED ORDER — FENTANYL CITRATE (PF) 100 MCG/2ML IJ SOLN
INTRAMUSCULAR | Status: DC | PRN
  Administered 2017-08-22: 25 ug via INTRAVENOUS

## 2017-08-22 MED ORDER — CEFAZOLIN SODIUM-DEXTROSE 2-4 GM/100ML-% IV SOLN
2.0000 g | INTRAVENOUS | Status: AC
  Administered 2017-08-22: 2 g via INTRAVENOUS

## 2017-08-22 MED ORDER — FENTANYL CITRATE (PF) 100 MCG/2ML IJ SOLN
INTRAMUSCULAR | Status: AC
  Filled 2017-08-22: qty 2

## 2017-08-22 MED ORDER — ONDANSETRON HCL 4 MG/2ML IJ SOLN
INTRAMUSCULAR | Status: DC | PRN
  Administered 2017-08-22: 4 mg via INTRAVENOUS

## 2017-08-22 MED ORDER — FENTANYL CITRATE (PF) 100 MCG/2ML IJ SOLN: 25.0000 ug | INTRAMUSCULAR | Status: DC | PRN

## 2017-08-22 MED ORDER — DEXAMETHASONE SODIUM PHOSPHATE 10 MG/ML IJ SOLN
INTRAMUSCULAR | Status: DC | PRN
  Administered 2017-08-22: 10 mg via INTRAVENOUS

## 2017-08-22 MED ORDER — ONDANSETRON HCL 4 MG/2ML IJ SOLN
INTRAMUSCULAR | Status: AC
  Filled 2017-08-22: qty 2

## 2017-08-22 MED ORDER — PHENYLEPHRINE 40 MCG/ML (10ML) SYRINGE FOR IV PUSH (FOR BLOOD PRESSURE SUPPORT)
PREFILLED_SYRINGE | INTRAVENOUS | Status: DC | PRN
  Administered 2017-08-22: 80 ug via INTRAVENOUS
  Administered 2017-08-22 (×2): 120 ug via INTRAVENOUS
  Administered 2017-08-22: 80 ug via INTRAVENOUS

## 2017-08-22 MED ORDER — ONDANSETRON HCL 4 MG/2ML IJ SOLN: 4.0000 mg | Freq: Once | INTRAMUSCULAR | Status: DC | PRN

## 2017-08-22 MED ORDER — LIDOCAINE 2% (20 MG/ML) 5 ML SYRINGE
INTRAMUSCULAR | Status: DC | PRN
  Administered 2017-08-22: 100 mg via INTRAVENOUS

## 2017-08-22 MED ORDER — TRAMADOL HCL 50 MG PO TABS
50.0000 mg | ORAL_TABLET | Freq: Four times a day (QID) | ORAL | Status: DC | PRN
Start: 2017-08-22 — End: 2017-08-22

## 2017-08-22 MED ORDER — PROPOFOL 10 MG/ML IV BOLUS
INTRAVENOUS | Status: AC
  Filled 2017-08-22: qty 20

## 2017-08-22 MED ORDER — LIDOCAINE 2% (20 MG/ML) 5 ML SYRINGE
INTRAMUSCULAR | Status: AC
  Filled 2017-08-22: qty 5

## 2017-08-22 MED ORDER — PHENYLEPHRINE 40 MCG/ML (10ML) SYRINGE FOR IV PUSH (FOR BLOOD PRESSURE SUPPORT)
PREFILLED_SYRINGE | INTRAVENOUS | Status: AC
  Filled 2017-08-22: qty 10

## 2017-08-22 MED ORDER — DEXAMETHASONE SODIUM PHOSPHATE 10 MG/ML IJ SOLN
INTRAMUSCULAR | Status: AC
  Filled 2017-08-22: qty 1

## 2017-08-22 MED ORDER — CEFAZOLIN SODIUM-DEXTROSE 2-4 GM/100ML-% IV SOLN
INTRAVENOUS | Status: AC
  Filled 2017-08-22: qty 100

## 2017-08-22 MED ORDER — PROPOFOL 10 MG/ML IV BOLUS
INTRAVENOUS | Status: DC | PRN
  Administered 2017-08-22: 100 mg via INTRAVENOUS

## 2017-08-22 MED ORDER — LACTATED RINGERS IV SOLN
INTRAVENOUS | Status: DC
  Administered 2017-08-22: 11:00:00 via INTRAVENOUS

## 2017-08-22 SURGICAL SUPPLY — 18 items
BAG URINE DRAINAGE (UROLOGICAL SUPPLIES) IMPLANT
BAG URO CATCHER STRL LF (MISCELLANEOUS) ×2 IMPLANT
CATH FOLEY 3WAY 30CC 22FR (CATHETERS) IMPLANT
CATH URET 5FR 28IN OPEN ENDED (CATHETERS) IMPLANT
COVER FOOTSWITCH UNIV (MISCELLANEOUS) IMPLANT
COVER SURGICAL LIGHT HANDLE (MISCELLANEOUS) ×2 IMPLANT
GLOVE SURG SS PI 8.0 STRL IVOR (GLOVE) IMPLANT
GOWN STRL REUS W/TWL XL LVL3 (GOWN DISPOSABLE) ×2 IMPLANT
HOLDER FOLEY CATH W/STRAP (MISCELLANEOUS) IMPLANT
LOOP CUT BIPOLAR 24F LRG (ELECTROSURGICAL) IMPLANT
MANIFOLD NEPTUNE II (INSTRUMENTS) ×2 IMPLANT
PACK CYSTO (CUSTOM PROCEDURE TRAY) ×2 IMPLANT
SET ASPIRATION TUBING (TUBING) ×2 IMPLANT
SUT ETHILON 3 0 PS 1 (SUTURE) IMPLANT
SYR 30ML LL (SYRINGE) IMPLANT
SYRINGE IRR TOOMEY STRL 70CC (SYRINGE) IMPLANT
TUBING CONNECTING 10 (TUBING) ×2 IMPLANT
TUBING UROLOGY SET (TUBING) ×2 IMPLANT

## 2017-08-22 NOTE — Anesthesia Postprocedure Evaluation (Signed)
Anesthesia Post Note  Patient: CONSEPCION UTT  Procedure(s) Performed: CYSTOSCOPY WITH BIOPSY FULGURATION (N/A )     Patient location during evaluation: PACU Anesthesia Type: General Level of consciousness: awake and alert Pain management: pain level controlled Vital Signs Assessment: post-procedure vital signs reviewed and stable Respiratory status: spontaneous breathing, nonlabored ventilation, respiratory function stable and patient connected to nasal cannula oxygen Cardiovascular status: blood pressure returned to baseline and stable Postop Assessment: no apparent nausea or vomiting Anesthetic complications: no    Last Vitals:  Vitals:   08/22/17 1330 08/22/17 1346  BP: 114/70 128/74  Pulse: 84 84  Resp: (!) 21 20  Temp: 36.6 C 36.7 C  SpO2: 91% 95%    Last Pain:  Vitals:   08/22/17 1346  TempSrc:   PainSc: 0-No pain                 Darika Ildefonso COKER

## 2017-08-22 NOTE — Anesthesia Postprocedure Evaluation (Signed)
Anesthesia Post Note  Patient: Beverly Francis  Procedure(s) Performed: CYSTOSCOPY WITH BIOPSY FULGURATION (N/A )     Patient location during evaluation: PACU Anesthesia Type: General Level of consciousness: awake and alert Pain management: pain level controlled Vital Signs Assessment: post-procedure vital signs reviewed and stable Respiratory status: spontaneous breathing, nonlabored ventilation, respiratory function stable and patient connected to nasal cannula oxygen Cardiovascular status: stable and blood pressure returned to baseline Postop Assessment: no apparent nausea or vomiting Anesthetic complications: no    Last Vitals:  Vitals:   08/22/17 1330 08/22/17 1346  BP: 114/70 128/74  Pulse: 84 84  Resp: (!) 21 20  Temp: 36.6 C 36.7 C  SpO2: 91% 95%    Last Pain:  Vitals:   08/22/17 1346  TempSrc:   PainSc: 0-No pain                 Mannie Wineland COKER

## 2017-08-22 NOTE — Interval H&P Note (Signed)
History and Physical Interval Note:  08/22/2017 12:06 PM  Beverly Francis  has presented today for surgery, with the diagnosis of history of bladder cancer  The various methods of treatment have been discussed with the patient and family. After consideration of risks, benefits and other options for treatment, the patient has consented to  Procedure(s): CYSTOSCOPY WITH BIOPSY FULGURATION (N/A) as a surgical intervention .  The patient's history has been reviewed, patient examined, no change in status, stable for surgery.  I have reviewed the patient's chart and labs.  Questions were answered to the patient's satisfaction.     Irine Seal

## 2017-08-22 NOTE — Discharge Instructions (Signed)
CYSTOSCOPY HOME CARE INSTRUCTIONS   May take tylenol for discomfort if needed  Activity: Rest for the remainder of the day.  Do not drive or operate equipment today.  You may resume normal activities in one to two days as instructed by your physician.   Meals: Drink plenty of liquids and eat light foods such as gelatin or soup this evening.  You may return to a normal meal plan tomorrow.  Return to Work: You may return to work in one to two days or as instructed by your physician.  Special Instructions / Symptoms: Call your physician if any of these symptoms occur:   -persistent or heavy bleeding  -bleeding which continues after first few urination  -large blood clots that are difficult to pass  -urine stream diminishes or stops completely  -fever equal to or higher than 101 degrees Farenheit.  -cloudy urine with a strong, foul odor  -severe pain  Females should always wipe from front to back after elimination.  You may feel some burning pain when you urinate.  This should disappear with time.  Applying moist heat to the lower abdomen or a hot tub bath may help relieve the pain. \   Patient Signature:  ________________________________________________________  Nurse's Signature:  ________________________________________________________

## 2017-08-22 NOTE — Op Note (Signed)
Procedure: Cystoscopy with biopsy and fulguration of 1.5 cm posterior bladder wall lesion.  Preop diagnosis: History of bladder cancer with posterior wall bladder lesion.  Postop diagnosis: Same.  Surgeon: Dr. Irine Seal.  Anesthesia: General.  Specimen: Bladder biopsies from posterior wall lesion.  Drain: None.  EBL: None.  Complications: None.  Indications: Beverly Francis is a 77 year old white female with a history of bladder cancer who was found on surveillance cystoscopy to have an erythematous lesion adjacent to her prior resection scar and it was felt that biopsy was indicated.  Procedure: She was given Ancef and was taken operating room where general anesthetic was induced.  She was placed in lithotomy position and fitted with PAS hose.  Her perineum and genitalia were prepped with Betadine solution and she was draped in usual sterile fashion.  Cystoscopy was performed using a 23 [place] scope and 30 degree lens examination revealed a normal urethra.  The bladder wall had mild trabeculation.  On the right posterior wall there was a stellate scar with an erythematous area superior to the scar that measured approximately 1 x 1.5 cm.  Ureteral orifices were unremarkable.  The lesion on the posterior wall was biopsied in 2 locations with the cup biopsy forceps.  The biopsy sites and the remaining abnormal mucosa was then generously fulgurated.  Once hemostasis was confirmed the bladder was drained.  The cystoscope was removed.  Her anesthetic was reversed, she was taken down from lithotomy position and moved to recovery room in stable condition.  There were no complications.

## 2017-08-22 NOTE — Anesthesia Procedure Notes (Signed)
Procedure Name: LMA Insertion Date/Time: 08/22/2017 12:15 PM Performed by: Lind Covert, CRNA Pre-anesthesia Checklist: Patient identified, Emergency Drugs available, Suction available and Patient being monitored Patient Re-evaluated:Patient Re-evaluated prior to induction Oxygen Delivery Method: Circle system utilized Preoxygenation: Pre-oxygenation with 100% oxygen Induction Type: IV induction LMA: LMA inserted LMA Size: 3.0 Number of attempts: 1 Placement Confirmation: positive ETCO2 and breath sounds checked- equal and bilateral Tube secured with: Tape Dental Injury: Teeth and Oropharynx as per pre-operative assessment

## 2017-08-22 NOTE — Anesthesia Preprocedure Evaluation (Signed)
Anesthesia Evaluation  Patient identified by MRN, date of birth, ID band Patient awake    Reviewed: Allergy & Precautions, NPO status , Patient's Chart, lab work & pertinent test results  Airway Mallampati: II  TM Distance: >3 FB Neck ROM: Full    Dental  (+) Teeth Intact, Dental Advisory Given   Pulmonary former smoker,    breath sounds clear to auscultation       Cardiovascular hypertension,  Rhythm:Regular Rate:Normal     Neuro/Psych    GI/Hepatic   Endo/Other    Renal/GU      Musculoskeletal   Abdominal   Peds  Hematology   Anesthesia Other Findings   Reproductive/Obstetrics                             Anesthesia Physical Anesthesia Plan  ASA: III  Anesthesia Plan: General   Post-op Pain Management:    Induction: Intravenous  PONV Risk Score and Plan: Ondansetron and Dexamethasone  Airway Management Planned: LMA  Additional Equipment:   Intra-op Plan:   Post-operative Plan:   Informed Consent: I have reviewed the patients History and Physical, chart, labs and discussed the procedure including the risks, benefits and alternatives for the proposed anesthesia with the patient or authorized representative who has indicated his/her understanding and acceptance.   Dental advisory given  Plan Discussed with: CRNA and Anesthesiologist  Anesthesia Plan Comments:         Anesthesia Quick Evaluation

## 2017-08-22 NOTE — Transfer of Care (Signed)
Immediate Anesthesia Transfer of Care Note  Patient: LYNNLEE REVELS  Procedure(s) Performed: CYSTOSCOPY WITH BIOPSY FULGURATION (N/A )  Patient Location: PACU  Anesthesia Type:General  Level of Consciousness: sedated  Airway & Oxygen Therapy: Patient Spontanous Breathing and Patient connected to face mask oxygen  Post-op Assessment: Report given to RN and Post -op Vital signs reviewed and stable  Post vital signs: Reviewed and stable  Last Vitals:  Vitals Value Taken Time  BP 106/68 08/22/2017 12:43 PM  Temp    Pulse    Resp 15 08/22/2017 12:45 PM  SpO2    Vitals shown include unvalidated device data.  Last Pain:  Vitals:   08/22/17 1048  TempSrc:   PainSc: 0-No pain         Complications: No apparent anesthesia complications

## 2017-08-23 ENCOUNTER — Encounter (HOSPITAL_COMMUNITY): Payer: Self-pay | Admitting: Urology

## 2017-09-04 DIAGNOSIS — N3941 Urge incontinence: Secondary | ICD-10-CM | POA: Diagnosis not present

## 2017-09-04 DIAGNOSIS — Z8551 Personal history of malignant neoplasm of bladder: Secondary | ICD-10-CM | POA: Diagnosis not present

## 2017-09-12 DIAGNOSIS — C678 Malignant neoplasm of overlapping sites of bladder: Secondary | ICD-10-CM | POA: Diagnosis not present

## 2017-09-19 DIAGNOSIS — C678 Malignant neoplasm of overlapping sites of bladder: Secondary | ICD-10-CM | POA: Diagnosis not present

## 2017-09-26 DIAGNOSIS — Z5111 Encounter for antineoplastic chemotherapy: Secondary | ICD-10-CM | POA: Diagnosis not present

## 2017-09-26 DIAGNOSIS — N3020 Other chronic cystitis without hematuria: Secondary | ICD-10-CM | POA: Diagnosis not present

## 2017-09-26 DIAGNOSIS — C678 Malignant neoplasm of overlapping sites of bladder: Secondary | ICD-10-CM | POA: Diagnosis not present

## 2017-10-02 DIAGNOSIS — Z1231 Encounter for screening mammogram for malignant neoplasm of breast: Secondary | ICD-10-CM | POA: Diagnosis not present

## 2017-10-03 DIAGNOSIS — Z5111 Encounter for antineoplastic chemotherapy: Secondary | ICD-10-CM | POA: Diagnosis not present

## 2017-10-03 DIAGNOSIS — C678 Malignant neoplasm of overlapping sites of bladder: Secondary | ICD-10-CM | POA: Diagnosis not present

## 2017-11-14 DIAGNOSIS — Z1389 Encounter for screening for other disorder: Secondary | ICD-10-CM | POA: Diagnosis not present

## 2017-11-14 DIAGNOSIS — C679 Malignant neoplasm of bladder, unspecified: Secondary | ICD-10-CM | POA: Diagnosis not present

## 2017-11-14 DIAGNOSIS — Z0000 Encounter for general adult medical examination without abnormal findings: Secondary | ICD-10-CM | POA: Diagnosis not present

## 2017-11-14 DIAGNOSIS — R636 Underweight: Secondary | ICD-10-CM | POA: Diagnosis not present

## 2017-11-15 ENCOUNTER — Other Ambulatory Visit: Payer: Self-pay | Admitting: Thoracic Surgery (Cardiothoracic Vascular Surgery)

## 2017-11-15 DIAGNOSIS — I712 Thoracic aortic aneurysm, without rupture, unspecified: Secondary | ICD-10-CM

## 2017-11-17 ENCOUNTER — Ambulatory Visit: Payer: Medicare Other | Admitting: Pulmonary Disease

## 2017-11-17 ENCOUNTER — Encounter: Payer: Self-pay | Admitting: Pulmonary Disease

## 2017-11-17 ENCOUNTER — Telehealth: Payer: Self-pay | Admitting: Pulmonary Disease

## 2017-11-17 VITALS — BP 110/76 | HR 74 | Ht 63.0 in | Wt 93.2 lb

## 2017-11-17 DIAGNOSIS — J432 Centrilobular emphysema: Secondary | ICD-10-CM

## 2017-11-17 MED ORDER — FLUTICASONE-UMECLIDIN-VILANT 100-62.5-25 MCG/INH IN AEPB: 1.0000 | INHALATION_SPRAY | Freq: Every day | RESPIRATORY_TRACT | 5 refills | Status: AC

## 2017-11-17 MED ORDER — FLUTICASONE-UMECLIDIN-VILANT 100-62.5-25 MCG/INH IN AEPB: 1.0000 | INHALATION_SPRAY | Freq: Every day | RESPIRATORY_TRACT | 0 refills | Status: AC

## 2017-11-17 NOTE — Patient Instructions (Signed)
We will give you a medication called trelegy.  Use this instead of the Breo and the Advair We will check the labs that he recently got with Dr. Delfina Redwood Will check spirometry today Follow-up in 3 months.

## 2017-11-17 NOTE — Telephone Encounter (Signed)
Records release has been faxed to Dr. Lina Sar office requesting recent lab results.  Will await records.

## 2017-11-17 NOTE — Progress Notes (Addendum)
Beverly Francis    [id number]    Oct 02, 1940  Primary Care Physician:Polite, Jori Moll, MD  Referring Physician: Seward Carol, MD [address] [place], [place] [address]  Chief complaint: Consult for COPD  HPI: 77 year old with stage Ia lung cancer status post resection, COPD, ex-smoker For for evaluation of COPD. History noted for left lower lobe superior segmentectomy by Dr. Roxan Francis in September 2015.  There is no evidence of cancer recurrence on surveillance.  He does have a subcentimeter pulmonary nodule which has remained stable and is thought to be benign  Maintain on Advair for many years.  Seen by Dr. Delfina Francis her primary care earlier this week for increased dyspnea, congestion, cough with mucus production.  With azithromycin and a prednisone taper.  She has been on this therapy for 2 days and feels that her symptoms are improving.  Denies any fevers, chills.  Pets: 4 dogs, no cats, birds, farm animals Occupation: Retired Marine scientist Exposures: No known exposures, no mold, hot tub Smoking history: 50-pack-year smoker.  Quit in 2015 Travel history: No significant travel history Relevant family history: No significant family history of lung disease.  Outpatient Encounter Medications as of 11/17/2017  Medication Sig  . ADVAIR DISKUS 250-50 MCG/DOSE AEPB Inhale 1 puff into the lungs 2 (two) times daily.   Marland Kitchen aspirin EC 81 MG tablet Take 81 mg by mouth daily.  Marland Kitchen EPIPEN 2-PAK 0.3 MG/0.3ML SOAJ injection Inject 0.3 mg into the muscle once.   Marland Kitchen ibuprofen (ADVIL,MOTRIN) 200 MG tablet Take 400-600 mg by mouth 2 (two) times daily as needed for headache or moderate pain.  Marland Kitchen lisinopril-hydrochlorothiazide (PRINZIDE,ZESTORETIC) 10-12.5 MG per tablet Take 1 tablet by mouth daily.   . Multiple Vitamin (MULTIVITAMIN WITH MINERALS) TABS tablet Take 1 tablet by mouth daily.  . VENTOLIN HFA 108 (90 Base) MCG/ACT inhaler Inhale 2 puffs into the lungs every 6 (six) hours as  needed for wheezing or shortness of breath.   Marland Kitchen azithromycin (ZITHROMAX) 250 MG tablet TK 2 TS PO FOR 1 DAY THEN TK 1 T PO D FOR 4 DAYS  . predniSONE (DELTASONE) 10 MG tablet    No facility-administered encounter medications on file as of 11/17/2017.     Allergies as of 11/17/2017 - Review Complete 11/17/2017  Allergen Reaction Noted  . Bee venom Other (See Comments) 09/19/2013  . Oxycodone Other (See Comments) 09/19/2013    Past Medical History:  Diagnosis Date  . Anxiety    takes Xanax daily as needed  . Arthritis   . Ascending aortic aneurysm (HCC)    Mild  . Bladder cancer ([place])   . Cataracts, bilateral   . COPD (chronic obstructive pulmonary disease) (HCC)    Advair daily and Ventolin as needed  . History of colon polyps   . Hypertension    takes Lisinopril daily  . Lung cancer ([place])   . Lung mass    left  . Nocturia   . Osteoporosis   . Shortness of breath    with exertion  . Urinary frequency     Past Surgical History:  Procedure Laterality Date  . BLADDER SURGERY     1996 and 2012 Dr Beverly Francis  . CATARACT EXTRACTION, BILATERAL    . COLONOSCOPY    . CYSTOSCOPY     multiple;urologist is Dr.Wrenn  . CYSTOSCOPY W/ RETROGRADES Bilateral 12/13/2016   Procedure: CYSTOSCOPY WITH RETROGRADE PYELOGRAM AND BILATERAL RENAL WASHINGS;  Surgeon: Beverly Seal, MD;  Location: [facility];  Service: Urology;  Laterality: Bilateral;  . CYSTOSCOPY WITH BIOPSY N/A 12/24/2013   Procedure: CYSTOSCOPY WITH BLADDER BIOPSY AND FULGERATION ;  Surgeon: Beverly So, MD;  Location: [facility];  Service: Urology;  Laterality: N/A;  . CYSTOSCOPY WITH BIOPSY     12/13/16 Dr. Jeffie Francis  . CYSTOSCOPY WITH BIOPSY N/A 12/13/2016   Procedure: CYSTOSCOPY WITH BIOPSY;  Surgeon: Beverly Seal, MD;  Location: [facility];  Service: Urology;  Laterality: N/A;  . CYSTOSCOPY WITH BIOPSY N/A 08/22/2017   Procedure: CYSTOSCOPY WITH BIOPSY FULGURATION;  Surgeon: Beverly Seal, MD;  Location: [facility];  Service:  Urology;  Laterality: N/A;  . HAND SURGERY Right 25+yrs ago  . TONSILLECTOMY  at age 65  . VIDEO ASSISTED THORACOSCOPY Left 11/13/2013   Procedure: VIDEO ASSISTED THORACOSCOPY, Left lower lobe superior segmentectomy, cryoanalgesia of intercostal nerves, node dissection;  Surgeon: Beverly Nakayama, MD;  Location: [facility];  Service: Thoracic; Laterality: Left;    Family History  Problem Relation Age of Onset  . Pulmonary embolism Father   . CVA Mother   . Heart disease Brother   . Lung cancer Sister   . Renal cancer Sister     Social History   Socioeconomic History  . Marital status: Divorced    Spouse name: Not on file  . Number of children: Not on file  . Years of education: Not on file  . Highest education level: Not on file  Occupational History  . Occupation: Marine scientist- RN  Social Needs  . Financial resource strain: Not on file  . Food insecurity:    Worry: Not on file    Inability: Not on file  . Transportation needs:    Medical: Not on file    Non-medical: Not on file  Tobacco Use  . Smoking status: Former Smoker    Packs/day: 1.00    Years: 50.00    Pack years: 50.00    Types: Cigarettes    Last attempt to quit: 11/20/2013    Years since quitting: 3.9  . Smokeless tobacco: Never Used  Substance and Sexual Activity  . Alcohol use: Yes    Comment: occasionally glass of wine  . Drug use: No  . Sexual activity: Not Currently    Birth control/protection: Post-menopausal  Lifestyle  . Physical activity:    Days per week: Not on file    Minutes per session: Not on file  . Stress: Not on file  Relationships  . Social connections:    Talks on phone: Not on file    Gets together: Not on file    Attends religious service: Not on file    Active member of club or organization: Not on file    Attends meetings of clubs or organizations: Not on file    Relationship status: Not on file  . Intimate partner violence:    Fear of current or ex partner: Not on file     Emotionally abused: Not on file    Physically abused: Not on file    Forced sexual activity: Not on file  Other Topics Concern  . Not on file  Social History Narrative  . Not on file   Review of systems: Review of Systems  Constitutional: Negative for fever and chills.  HENT: Negative.   Eyes: Negative for blurred vision.  Respiratory: as per HPI  Cardiovascular: Negative for chest pain and palpitations.  Gastrointestinal: Negative for vomiting, diarrhea, blood per rectum. Genitourinary: Negative for dysuria,  urgency, frequency and hematuria.  Musculoskeletal: Negative for myalgias, back pain and joint pain.  Skin: Negative for itching and rash.  Neurological: Negative for dizziness, tremors, focal weakness, seizures and loss of consciousness.  Endo/Heme/Allergies: Negative for environmental allergies.  Psychiatric/Behavioral: Negative for depression, suicidal ideas and hallucinations.  All other systems reviewed and are negative.  Physical Exam: Blood pressure 110/76, pulse 74, height 5\' 3"  (1.6 m), weight 93 lb 3.2 oz (42.3 kg), SpO2 95 %. Gen:      No acute distress HEENT:  EOMI, sclera anicteric Neck:     No masses; no thyromegaly Lungs:    Clear to auscultation bilaterally; normal respiratory effort CV:         Regular rate and rhythm; no murmurs Abd:      + bowel sounds; soft, non-tender; no palpable masses, no distension Ext:    No edema; adequate peripheral perfusion Skin:      Warm and dry; no rash Neuro: alert and oriented x 3 Psych: normal mood and affect  Data Reviewed: Imaging: CT chest 09/09/2013-related mass in the superior segment of the left lower lobe measuring 2.5 cm. PET scan 9/45/0388- hypermetabolic 2.5 cm nodule in the left lower lobe CT scan 12/27/2016- stable postsurgical changes.  6 mm left apical nodule stable, 3 mm right upper lobe nodule is stable. I have reviewed the images personally.  PFTs: 10/04/2013-FVC 2.15 [7 7%), FEV1 1.02 [49%], F/F 47,  TLC 128%, DLCO 57% Severe obstruction, air trapping and hyperinflation.  Moderate reduction of diffusion capacity.  Spirometry 11/17/2017 FVC 1.7 (6 4%), FEV1 0.8 [39%], F/F 45 Severe obstruction.  Labs:  Assessment:  Severe COPD She is recovering from a exacerbation.  Currently on azithromycin, prednisone She has been on Advair for a long time but feels it is not working as well We will switch her to Trelegy inhaler  Obtain recent labs including CBC, differential from primary care Follow lung function with spirometry.  Health maintenance 11/15/2017-influenza 09/02/2013-Prevnar 3/2/8-Pneumovax  Plan/Recommendations: - Finish azithromycin, prednisone - Stop Advair, start trelegy. - Review labs from PCP   Marshell Garfinkel MD [facility] 11/17/2017, 11:57 AM  CC: Beverly Carol, MD   Addendum: Received records from primary care  Comprehensive metabolic panel 11/01/32 -within normal limits  CBC 11/14/2017 -WBC 11.3, eos 2.3%, absolute eosinophil count 260 CBC 10/24/2016-WBC 8.7, eos 5.7%, absolute eosinophil count 496. CBC 09/29/2015-WBC 10.1, eos 7.2%, absolute eosinophil count 727  Marshell Garfinkel MD [facility] 12/08/2017, 9:23 AM

## 2017-11-21 NOTE — Telephone Encounter (Signed)
Records have been received and placed in Dr. Matilde Bash lok at. Nothing further is needed.

## 2018-01-02 ENCOUNTER — Ambulatory Visit
Admission: RE | Admit: 2018-01-02 | Discharge: 2018-01-02 | Disposition: A | Discharge status: Home / Self Care | Payer: Medicare Other | Source: Ambulatory Visit | Attending: Thoracic Surgery (Cardiothoracic Vascular Surgery) | Admitting: Thoracic Surgery (Cardiothoracic Vascular Surgery)

## 2018-01-02 ENCOUNTER — Ambulatory Visit: Payer: Medicare Other | Admitting: Thoracic Surgery (Cardiothoracic Vascular Surgery)

## 2018-01-02 ENCOUNTER — Other Ambulatory Visit: Payer: Self-pay

## 2018-01-02 ENCOUNTER — Encounter: Payer: Self-pay | Admitting: Thoracic Surgery (Cardiothoracic Vascular Surgery)

## 2018-01-02 VITALS — BP 106/60 | HR 82 | Resp 16 | Ht 63.0 in | Wt 97.8 lb

## 2018-01-02 DIAGNOSIS — C3490 Malignant neoplasm of unspecified part of unspecified bronchus or lung: Secondary | ICD-10-CM | POA: Diagnosis not present

## 2018-01-02 DIAGNOSIS — I712 Thoracic aortic aneurysm, without rupture, unspecified: Secondary | ICD-10-CM

## 2018-01-02 DIAGNOSIS — R918 Other nonspecific abnormal finding of lung field: Secondary | ICD-10-CM

## 2018-01-02 DIAGNOSIS — I700 Atherosclerosis of aorta: Secondary | ICD-10-CM | POA: Diagnosis not present

## 2018-01-02 DIAGNOSIS — R911 Solitary pulmonary nodule: Secondary | ICD-10-CM

## 2018-01-02 NOTE — Progress Notes (Signed)
HPI: Mrs. Amend returns for a scheduled follow-up visit  Zain Bingman is a 77 year old woman with a history of tobacco abuse COPD, stage Ia non-small cell carcinoma treated with left lower lobe superior segmentectomy in 2015, thoracic aortic atherosclerosis and a 4.1 cm ascending aneurysm.  I have been following her since her superior segmentectomy in 2015.  Last saw her in October 2018. She was doing well at that time.  She did have multiple small lung nodules that were stable.  Over the past year she is done fairly well.  She has not had any significant weight loss.  Her appetite is unchanged.  She is not having any unusual headaches or visual changes.  She says she did have some respiratory issues about 6 or 8 weeks ago.  She was put on antibiotics and prednisone by Dr. Delfina Redwood.  Her symptoms improved rapidly with that.  She quit smoking in 2015  Past Medical History:  Diagnosis Date  . Anxiety    takes Xanax daily as needed  . Arthritis   . Ascending aortic aneurysm (HCC)    Mild  . Bladder cancer ([place])   . Cataracts, bilateral   . COPD (chronic obstructive pulmonary disease) (HCC)    Advair daily and Ventolin as needed  . History of colon polyps   . Hypertension    takes Lisinopril daily  . Lung cancer ([place])   . Lung mass    left  . Nocturia   . Osteoporosis   . Shortness of breath    with exertion  . Urinary frequency     Current Outpatient Medications  Medication Sig Dispense Refill  . aspirin EC 81 MG tablet Take 81 mg by mouth daily.    Marland Kitchen EPIPEN 2-PAK 0.3 MG/0.3ML SOAJ injection Inject 0.3 mg into the muscle once.     . Fluticasone-Umeclidin-Vilant (TRELEGY ELLIPTA) 100-62.5-25 MCG/INH AEPB Inhale 1 puff into the lungs daily.    Marland Kitchen ibuprofen (ADVIL,MOTRIN) 200 MG tablet Take 400-600 mg by mouth 2 (two) times daily as needed for headache or moderate pain.    Marland Kitchen lisinopril-hydrochlorothiazide (PRINZIDE,ZESTORETIC) 10-12.5 MG per tablet Take 1 tablet by mouth  daily.   2  . Multiple Vitamin (MULTIVITAMIN WITH MINERALS) TABS tablet Take 1 tablet by mouth daily.    . VENTOLIN HFA 108 (90 Base) MCG/ACT inhaler Inhale 2 puffs into the lungs every 6 (six) hours as needed for wheezing or shortness of breath.   2   No current facility-administered medications for this visit.     Physical Exam BP 106/60 (BP Location: Right Arm, Patient Position: Sitting, Cuff Size: Normal)   Pulse 82   Resp 16   Ht 5\' 3"  (1.6 m)   Wt 97 lb 12.8 oz (44.4 kg)   SpO2 94% Comment: ON RA  BMI 17.90 kg/m  77 year old woman in no acute distress Thin Alert and oriented x3 with no focal deficits No cervical or supra clavicular adenopathy Lungs diminished breath sounds bilaterally, no rales or wheezing Cardiac regular rate and rhythm  Diagnostic Tests: CT CHEST WITHOUT CONTRAST  TECHNIQUE: Multidetector CT imaging of the chest was performed following the standard protocol without IV contrast.  COMPARISON:  12/27/2016  FINDINGS: Cardiovascular: The heart size appears normal. Aortic atherosclerosis. The ascending thoracic aorta Measures 4.1 cm, image 37/11. Unchanged. Multi vessel coronary artery atherosclerotic calcifications.  Mediastinum/Nodes: Normal appearance of the thyroid gland. The trachea appears patent and is midline. Normal appearance of the esophagus. No axillary or supraclavicular adenopathy.  No mediastinal or hilar adenopathy.  Lungs/Pleura: Moderate to advanced changes of emphysema. No pleural effusion. Status post left lower lobe superior segmentectomy. No complications. There is a small in a stable nodule within the left upper lobe measuring 4 mm, image 24/15. A new nodule within the left upper lobe measures 1 cm, image 54/11. Within the right upper lobe there is a new tiny nodule measuring 3 mm, image 38/15. Nonspecific. Stable appearance of 4 mm nodule within the posteromedial right upper lobe, image 37/15. Also new from previous  examination is a 4 mm nodule within the posterolateral right upper lobe, image 59/15.  Upper Abdomen: No acute abnormality.  Aortic atherosclerosis.  Musculoskeletal: Spondylosis within the thoracic spine. No aggressive lytic or sclerotic bone lesions identified.  IMPRESSION: 1. Stable postsurgical changes from left lower lobe superior segmentectomy. 2. Several new nodules are identified in both lungs. The largest is in the left upper lobe measuring 1 cm. Non-contrast chest CT at 3-6 months is recommended. If the nodules are stable at time of repeat CT, then future CT at 18-24 months (from today's scan) is considered optional for low-risk patients, but is recommended for high-risk patients. This recommendation follows the consensus statement: Guidelines for Management of Incidental Pulmonary Nodules Detected on CT Images: From the Fleischner Society 2017; Radiology 2017; 284:228-243. 3. Stable ascending thoracic aortic aneurysm measuring 4.1 cm. Aortic aneurysm NOS (ICD10-I71.9). Recommend annual imaging followup by CTA or MRA. This recommendation follows 2010 ACCF/AHA/AATS/ACR/ASA/SCA/SCAI/SIR/STS/SVM Guidelines for the Diagnosis and Management of Patients with Thoracic Aortic Disease. Circulation. 2010; 121: Z610-R604 4. Aortic Atherosclerosis (ICD10-I70.0) and Emphysema (ICD10-J43.9). 5. Multi vessel coronary artery atherosclerotic calcifications.   Electronically Signed   By: Kerby Moors M.D.   On: 01/02/2018 15:01 I personally reviewed the CT images and concur with the findings noted above.  The left upper lobe nodule has a triangular shaped to it would suggest possible mucus impaction.  Impression: Mrs. Oblinger is a 77 year old woman with a history of tobacco abuse and significant COPD.  She had a superior segmentectomy for a stage IA non-small cell carcinoma about 4 years ago.  She also has a history of atherosclerotic cardiovascular disease with aortic  atherosclerosis and an ascending aneurysm.  Stage Ia non-small cell carcinoma-has multiple tiny lung nodules and one larger nodule in the apex in the left upper lobe.  This is an area of significant emphysema.  There is a triangular-shaped of this which suggest to me that it might be mucus impaction rather than a neoplasm.  To be on the safe side given her history I think we should do another CT scan in 3 months.  Thoracic aortic atherosclerosis/ascending aneurysm-stable at 4 cm.  Needs continued annual follow-up.  Plan: Return in 3 months with CT chest  Melrose Nakayama, MD [facility] [phone]

## 2018-02-13 ENCOUNTER — Other Ambulatory Visit: Payer: Self-pay | Admitting: Thoracic Surgery (Cardiothoracic Vascular Surgery)

## 2018-02-13 DIAGNOSIS — R911 Solitary pulmonary nodule: Secondary | ICD-10-CM

## 2018-02-19 ENCOUNTER — Ambulatory Visit: Payer: Medicare Other | Admitting: Pulmonary Disease

## 2018-03-27 ENCOUNTER — Encounter: Payer: Self-pay | Admitting: Pulmonary Disease

## 2018-03-27 ENCOUNTER — Ambulatory Visit: Payer: Medicare Other | Admitting: Pulmonary Disease

## 2018-03-27 VITALS — BP 122/74 | HR 73 | Ht 63.0 in | Wt 99.6 lb

## 2018-03-27 DIAGNOSIS — Z23 Encounter for immunization: Secondary | ICD-10-CM | POA: Diagnosis not present

## 2018-03-27 DIAGNOSIS — J432 Centrilobular emphysema: Secondary | ICD-10-CM | POA: Diagnosis not present

## 2018-03-27 NOTE — Patient Instructions (Signed)
Continue Trelegy.  You need to use this every day for best effect We will check your oxygen levels on exertion We will give you a Pneumovax vaccination today  Follow-up in 6 months.

## 2018-03-27 NOTE — Progress Notes (Signed)
Beverly Francis    [id number]    1940/09/22  Primary Care Physician:Polite, Jori Moll, MD  Referring Physician: Seward Carol, MD [address] [place], [place] [address]  Chief complaint: Follow up for COPD  HPI: 78 year old with stage Ia lung cancer status post resection, COPD, ex-smoker For for evaluation of COPD. History noted for left lower lobe superior segmentectomy by Dr. Roxan Hockey in September 2015.  There is no evidence of cancer recurrence on surveillance.  He does have a subcentimeter pulmonary nodule which has remained stable and is thought to be benign  Maintain on Advair for many years.  Inhalers changed to Trelegy September 2019  Pets: 4 dogs, no cats, birds, farm animals Occupation: Retired Marine scientist Exposures: No known exposures, no mold, hot tub Smoking history: 50-pack-year smoker.  Quit in 2015 Travel history: No significant travel history Relevant family history: No significant family history of lung disease.  Interim history: Continues on Trelegy inhaler.  She is using this intermittently every 3 to 4 days States that dyspnea is stable at baseline  CT scan last year noted with new left upper lobe lung nodule.  She has a follow-up CT ordered for next week per Dr. Roxan Hockey.  Outpatient Encounter Medications as of 03/27/2018  Medication Sig  . aspirin EC 81 MG tablet Take 81 mg by mouth daily.  . Fluticasone-Umeclidin-Vilant (TRELEGY ELLIPTA) 100-62.5-25 MCG/INH AEPB Inhale 1 puff into the lungs daily.  Marland Kitchen ibuprofen (ADVIL,MOTRIN) 200 MG tablet Take 400-600 mg by mouth 2 (two) times daily as needed for headache or moderate pain.  Marland Kitchen lisinopril-hydrochlorothiazide (PRINZIDE,ZESTORETIC) 10-12.5 MG per tablet Take 1 tablet by mouth daily.   . Multiple Vitamin (MULTIVITAMIN WITH MINERALS) TABS tablet Take 1 tablet by mouth daily.  . VENTOLIN HFA 108 (90 Base) MCG/ACT inhaler Inhale 2 puffs into the lungs every 6 (six) hours as needed for  wheezing or shortness of breath.   . EPIPEN 2-PAK 0.3 MG/0.3ML SOAJ injection Inject 0.3 mg into the muscle once.    No facility-administered encounter medications on file as of 03/27/2018.    Physical Exam: Blood pressure 110/76, pulse 74, height 5\' 3"  (1.6 m), weight 93 lb 3.2 oz (42.3 kg), SpO2 95 %. Gen:      No acute distress HEENT:  EOMI, sclera anicteric Neck:     No masses; no thyromegaly Lungs:    Clear to auscultation bilaterally; normal respiratory effort CV:         Regular rate and rhythm; no murmurs Abd:      + bowel sounds; soft, non-tender; no palpable masses, no distension Ext:    No edema; adequate peripheral perfusion Skin:      Warm and dry; no rash Neuro: alert and oriented x 3 Psych: normal mood and affect  Data Reviewed: Imaging: CT chest 09/09/2013-related mass in the superior segment of the left lower lobe measuring 2.5 cm. PET scan 2/45/8099- hypermetabolic 2.5 cm nodule in the left lower lobe CT scan 12/27/2016- stable postsurgical changes.  6 mm left apical nodule stable, 3 mm right upper lobe nodule is stable. CT scan 01/02/2018- emphysema, postsurgical cysts changes, new nodule within the left upper lobe measuring 1 cm.  3 mm new right upper lobe nodule.  Other lung nodules appear stable. I have reviewed the images personally.  PFTs: 10/04/2013-FVC 2.15 [7 7%), FEV1 1.02 [49%], F/F 47, TLC 128%, DLCO 57% Severe obstruction, air trapping and hyperinflation.  Moderate reduction of diffusion capacity.  Spirometry 11/17/2017 FVC 1.7 (6 4%), FEV1 0.8 [39%], F/F 45 Severe obstruction.  Labs: Records from primary care Comprehensive metabolic panel 5/79/0383 -within normal limits CBC 11/14/2017 -WBC 11.3, eos 2.3%, absolute eosinophil count 260 CBC 10/24/2016-WBC 8.7, eos 5.7%, absolute eosinophil count 496. CBC 09/29/2015-WBC 10.1, eos 7.2%, absolute eosinophil count 727  Assessment:  Severe COPD Continue on Trelegy inhaler.  Emphasized to her that she needs  to use this inhaler every day for optimal effect Check O2 levels on exertion  Stage Ia non-small cell carcinoma s/p left lower lobe superior segmentectomy 2015 Lung nodule Repeat CT scheduled for later this week Follows with Dr. Roxan Hockey.  Health maintenance 11/15/2017-Influenza 09/02/2013-Prevnar 3/2/8-Pneumovax. Repeat vaccination today  Plan/Recommendations: - Continue Trelegy - Check O2 levels on exertion - Pneumovax.  Marshell Garfinkel MD [facility] 03/27/2018, 10:12 AM  CC: Seward Carol, MD

## 2018-03-30 ENCOUNTER — Ambulatory Visit
Admission: RE | Admit: 2018-03-30 | Discharge: 2018-03-30 | Disposition: A | Discharge status: Home / Self Care | Payer: Medicare Other | Source: Ambulatory Visit | Attending: Thoracic Surgery (Cardiothoracic Vascular Surgery) | Admitting: Thoracic Surgery (Cardiothoracic Vascular Surgery)

## 2018-03-30 ENCOUNTER — Other Ambulatory Visit: Payer: Medicare Other

## 2018-03-30 DIAGNOSIS — R911 Solitary pulmonary nodule: Secondary | ICD-10-CM

## 2018-03-30 DIAGNOSIS — R918 Other nonspecific abnormal finding of lung field: Secondary | ICD-10-CM | POA: Diagnosis not present

## 2018-04-03 ENCOUNTER — Other Ambulatory Visit: Payer: Medicare Other

## 2018-04-03 ENCOUNTER — Encounter: Payer: Self-pay | Admitting: Thoracic Surgery (Cardiothoracic Vascular Surgery)

## 2018-04-03 ENCOUNTER — Ambulatory Visit: Payer: Medicare Other | Admitting: Thoracic Surgery (Cardiothoracic Vascular Surgery)

## 2018-04-03 VITALS — BP 122/60 | HR 88 | Resp 20 | Ht 63.0 in | Wt 103.0 lb

## 2018-04-03 DIAGNOSIS — I700 Atherosclerosis of aorta: Secondary | ICD-10-CM

## 2018-04-03 DIAGNOSIS — R918 Other nonspecific abnormal finding of lung field: Secondary | ICD-10-CM | POA: Diagnosis not present

## 2018-04-03 NOTE — Progress Notes (Signed)
[address]       [place] [address]             [phone]    HPI: Mrs. Coltrane returns for a scheduled follow-up visit  Misha Vanoverbeke is a 78 year old woman with a history of tobacco abuse, COPD, stage Ia non-small cell carcinoma resected in 2015, thoracic aortic atherosclerosis, and a 4.1 cm ascending aneurysm.  I have followed her since her left lower lobe superior segmentectomy and September 2015.  I last saw her in October 2019.  She was doing well at that time, but her CT scan showed multiple new bilateral lung nodules.  The most worrisome was a 1 cm nodule in the left upper lobe.  This was in an area of significant emphysema and had a triangular-like shape.  I felt this was likely not cancer but given her history she needed a repeat CT.  In the interim since her last visit she was having significant back pain for about 3 days.  That started in the center of the back and radiated around the right side.  She took some muscle relaxants and that resolved Sunday.  She did not have any pain yesterday or today.  Her respiratory status is stable.  She has not had any significant weight loss.  Past Medical History:  Diagnosis Date  . Anxiety    takes Xanax daily as needed  . Arthritis   . Ascending aortic aneurysm (HCC)    Mild  . Bladder cancer ([place])   . Cataracts, bilateral   . COPD (chronic obstructive pulmonary disease) (HCC)    Advair daily and Ventolin as needed  . History of colon polyps   . Hypertension    takes Lisinopril daily  . Lung cancer ([place])   . Lung mass    left  . Nocturia   . Osteoporosis   . Shortness of breath    with exertion  . Urinary frequency      Current Outpatient Medications  Medication Sig Dispense Refill  . aspirin EC 81 MG tablet Take 81 mg by mouth daily.    Marland Kitchen EPIPEN 2-PAK 0.3 MG/0.3ML SOAJ injection Inject 0.3 mg into the muscle once.     . Fluticasone-Umeclidin-Vilant (TRELEGY ELLIPTA) 100-62.5-25 MCG/INH AEPB  Inhale 1 puff into the lungs daily.    Marland Kitchen ibuprofen (ADVIL,MOTRIN) 200 MG tablet Take 400-600 mg by mouth 2 (two) times daily as needed for headache or moderate pain.    Marland Kitchen lisinopril-hydrochlorothiazide (PRINZIDE,ZESTORETIC) 10-12.5 MG per tablet Take 1 tablet by mouth daily.   2  . Multiple Vitamin (MULTIVITAMIN WITH MINERALS) TABS tablet Take 1 tablet by mouth daily.    . VENTOLIN HFA 108 (90 Base) MCG/ACT inhaler Inhale 2 puffs into the lungs every 6 (six) hours as needed for wheezing or shortness of breath.   2   No current facility-administered medications for this visit.     Physical Exam BP 122/60   Pulse 88   Resp 20   Ht 5\' 3"  (1.6 m)   Wt 103 lb (46.7 kg)   SpO2 95% Comment: RA  BMI 18.14 kg/m  78 year old woman in no acute distress Alert and oriented x3 with no focal deficits Diminished breath sounds bilaterally Cardiac regular rate and rhythm normal S1 and S2  Diagnostic Tests: CT CHEST WITHOUT CONTRAST  TECHNIQUE: Multidetector CT imaging of the chest was performed following the standard protocol without IV contrast.  COMPARISON:  01/02/2018  FINDINGS: Cardiovascular:  Heart size appears normal. No pericardial effusion. Aortic atherosclerosis. Calcifications within the RCA, LAD and left circumflex coronary arteries noted.  Mediastinum/Nodes: No enlarged mediastinal or axillary lymph nodes. Thyroid gland, trachea, and esophagus demonstrate no significant findings.  Lungs/Pleura: There are moderate changes of centrilobular emphysema. No pleural effusion, airspace consolidation or pneumothorax. Postoperative changes from left lower lobe VATS procedure noted. Pulmonary nodule within the left upper lobe measures 6 mm, image 20/8. Previously 8 mm. Nodule within the posterior left upper lobe measures 0.4 x 0.3 cm, image 22/8. Unchanged. Posteromedial right upper lobe lung nodule is unchanged measuring 4 mm, image 31/8. Central right upper lobe lung nodule is  unchanged measuring 3 mm, image 32/8. No new or enlarging nodules.  Upper Abdomen: No acute abnormality.  Musculoskeletal: Spondylosis noted within the thoracic spine. No chest wall mass or suspicious bone lesions identified.  IMPRESSION: 1. Stable postsurgical changes from right lower lobe superior segmentectomy. 2. Small bilateral upper lobe pulmonary nodules are again seen in appears stable from previous exam. The largest measures 6 mm. No new or progressive nodularity identified. 3. Aortic Atherosclerosis (ICD10-I70.0) and Emphysema (ICD10-J43.9). 4. Coronary artery atherosclerotic calcifications   Electronically Signed   By: Kerby Moors M.D.   On: 03/30/2018 14:11 I personally reviewed the CT images and concur with the findings noted above  Impression: Mrs. Pint is a 78 year old former smoker with a history of a stage Ia non-small cell carcinoma resected in 2015.  Recent CT showed multiple lung nodules.  She now returns for a short interval follow-up.  The most worrisome nodule was a 1 cm area in the left upper lobe.  That is significantly smaller on today's CT scan which would go along with the being area of mucus impaction or localized infection.  The other smaller 3 to 4 mm nodules are all stable to slightly improved.  There are no new suspicious nodules on today's scan.  I will plan to rescan her again in about 6 months.  Aortic atherosclerosis and coronary atherosclerosis-stable, asymptomatic  Back pain-unclear etiology.  Unremarkable exam.  Recommended that if the pain were to recur she should be evaluated.  Plan: Return in 6 months with CT chest.  That will be a 5-year follow-up from her original surgery.  Melrose Nakayama, MD [facility] [phone]

## 2018-05-10 ENCOUNTER — Other Ambulatory Visit: Payer: Self-pay | Admitting: Internal Medicine

## 2018-05-10 ENCOUNTER — Ambulatory Visit
Admission: RE | Admit: 2018-05-10 | Discharge: 2018-05-10 | Disposition: A | Discharge status: Home / Self Care | Payer: Medicare Other | Source: Ambulatory Visit | Attending: Internal Medicine | Admitting: Internal Medicine

## 2018-05-10 DIAGNOSIS — R059 Cough, unspecified: Secondary | ICD-10-CM

## 2018-05-10 DIAGNOSIS — R05 Cough: Secondary | ICD-10-CM

## 2018-05-10 DIAGNOSIS — R071 Chest pain on breathing: Secondary | ICD-10-CM | POA: Diagnosis not present

## 2018-05-10 DIAGNOSIS — J441 Chronic obstructive pulmonary disease with (acute) exacerbation: Secondary | ICD-10-CM | POA: Diagnosis not present

## 2018-05-15 DIAGNOSIS — I10 Essential (primary) hypertension: Secondary | ICD-10-CM | POA: Diagnosis not present

## 2018-05-15 DIAGNOSIS — C679 Malignant neoplasm of bladder, unspecified: Secondary | ICD-10-CM | POA: Diagnosis not present

## 2018-05-15 DIAGNOSIS — I712 Thoracic aortic aneurysm, without rupture: Secondary | ICD-10-CM | POA: Diagnosis not present

## 2018-05-15 DIAGNOSIS — J441 Chronic obstructive pulmonary disease with (acute) exacerbation: Secondary | ICD-10-CM | POA: Diagnosis not present

## 2018-07-17 ENCOUNTER — Other Ambulatory Visit: Payer: Self-pay | Admitting: *Deleted

## 2018-07-17 DIAGNOSIS — R918 Other nonspecific abnormal finding of lung field: Secondary | ICD-10-CM

## 2018-07-17 NOTE — Progress Notes (Unsigned)
Ct chest

## 2018-09-27 ENCOUNTER — Other Ambulatory Visit: Payer: Medicare Other

## 2018-10-02 ENCOUNTER — Ambulatory Visit: Payer: Medicare Other | Admitting: Thoracic Surgery (Cardiothoracic Vascular Surgery)

## 2018-10-08 DIAGNOSIS — Z1231 Encounter for screening mammogram for malignant neoplasm of breast: Secondary | ICD-10-CM | POA: Diagnosis not present

## 2018-10-17 ENCOUNTER — Other Ambulatory Visit: Payer: Self-pay | Admitting: Pulmonary Disease

## 2018-11-06 ENCOUNTER — Inpatient Hospital Stay: Admission: RE | Admit: 2018-11-06 | Payer: Medicare Other | Source: Ambulatory Visit

## 2018-11-06 ENCOUNTER — Other Ambulatory Visit: Payer: Self-pay

## 2018-11-06 ENCOUNTER — Ambulatory Visit
Admission: RE | Admit: 2018-11-06 | Discharge: 2018-11-06 | Disposition: A | Discharge status: Home / Self Care | Payer: Medicare Other | Source: Ambulatory Visit | Attending: Thoracic Surgery (Cardiothoracic Vascular Surgery) | Admitting: Thoracic Surgery (Cardiothoracic Vascular Surgery)

## 2018-11-06 ENCOUNTER — Encounter: Payer: Self-pay | Admitting: Thoracic Surgery (Cardiothoracic Vascular Surgery)

## 2018-11-06 ENCOUNTER — Ambulatory Visit: Payer: Medicare Other | Admitting: Thoracic Surgery (Cardiothoracic Vascular Surgery)

## 2018-11-06 VITALS — BP 143/81 | HR 79 | Temp 97.0°F | Resp 16 | Ht 63.0 in | Wt 100.0 lb

## 2018-11-06 DIAGNOSIS — Z902 Acquired absence of lung [part of]: Secondary | ICD-10-CM | POA: Diagnosis not present

## 2018-11-06 DIAGNOSIS — C3432 Malignant neoplasm of lower lobe, left bronchus or lung: Secondary | ICD-10-CM

## 2018-11-06 DIAGNOSIS — C3490 Malignant neoplasm of unspecified part of unspecified bronchus or lung: Secondary | ICD-10-CM | POA: Diagnosis not present

## 2018-11-06 DIAGNOSIS — I7121 Aneurysm of the ascending aorta, without rupture: Secondary | ICD-10-CM

## 2018-11-06 DIAGNOSIS — I712 Thoracic aortic aneurysm, without rupture: Secondary | ICD-10-CM

## 2018-11-06 DIAGNOSIS — R918 Other nonspecific abnormal finding of lung field: Secondary | ICD-10-CM | POA: Diagnosis not present

## 2018-11-06 NOTE — Progress Notes (Signed)
Beverly Francis       [place], [address]             [phone]     HPI: Ms. Beverly Francis returns for a scheduled follow-up visit  Beverly Francis is a 78 year old woman with a history of tobacco abuse, COPD, lung cancer, thoracic aortic atherosclerosis, and a 4.1 cm ascending aneurysm.  I did a left lower lobe superior segmentectomy in September 2015 for a stage Ia non-small cell carcinoma.  I last saw him in the office in January 2020.  She had a CT in October 2019 which showed multiple bilateral lung nodules.  There was a 1 cm nodule in the left upper lobe.  On follow-up those nodules had improved.  She says she has had a cold for about the past 10 days.  She has had a lot of cough and congestion.  She thinks she may have had COVID back in the spring.  She otherwise feels well.  She quit smoking around the time of her surgery in 2015.  Past Medical History:  Diagnosis Date  . Anxiety    takes Xanax daily as needed  . Arthritis   . Ascending aortic aneurysm (HCC)    Mild  . Bladder cancer ([place])   . Cataracts, bilateral   . COPD (chronic obstructive pulmonary disease) (HCC)    Advair daily and Ventolin as needed  . History of colon polyps   . Hypertension    takes Lisinopril daily  . Lung cancer ([place])   . Lung mass    left  . Nocturia   . Osteoporosis   . Shortness of breath    with exertion  . Urinary frequency     Current Outpatient Medications  Medication Sig Dispense Refill  . aspirin EC 81 MG tablet Take 81 mg by mouth daily.    Marland Kitchen EPIPEN 2-PAK 0.3 MG/0.3ML SOAJ injection Inject 0.3 mg into the muscle once.     Marland Kitchen ibuprofen (ADVIL,MOTRIN) 200 MG tablet Take 400-600 mg by mouth 2 (two) times daily as needed for headache or moderate pain.    Marland Kitchen lisinopril-hydrochlorothiazide (PRINZIDE,ZESTORETIC) 10-12.5 MG per tablet Take 1 tablet by mouth daily.   2  . Multiple Vitamin (MULTIVITAMIN WITH MINERALS) TABS tablet Take 1 tablet by mouth daily.    . TRELEGY  ELLIPTA 100-62.5-25 MCG/INH AEPB INHALE 1 PUFF INTO THE LUNGS DAILY 60 each 3  . VENTOLIN HFA 108 (90 Base) MCG/ACT inhaler Inhale 2 puffs into the lungs every 6 (six) hours as needed for wheezing or shortness of breath.   2   No current facility-administered medications for this visit.     Physical Exam BP (!) 143/81 (BP Location: Left Arm, Patient Position: Sitting, Cuff Size: Normal)   Pulse 79 Comment: RA  Temp (!) 97 F (36.1 C)   Resp 16   Ht 5\' 3"  (1.6 m)   Wt 100 lb (45.4 kg)   SpO2 91% Comment: RA  BMI 17.7 kg/m  78 year old woman in no acute distress Alert and oriented x3 with no focal deficit No cervical or supraclavicular adenopathy Diminished breath sounds bilaterally but otherwise clear Cardiac regular rate and rhythm  Diagnostic Tests: CT CHEST WITHOUT CONTRAST  TECHNIQUE: Multidetector CT imaging of the chest was performed following the standard protocol without IV contrast.  COMPARISON:  Chest CT 03/30/2018, 01/02/2018 and 12/27/2016.  FINDINGS: Cardiovascular: There is fairly extensive atherosclerosis of the aorta, great vessels and coronary arteries. There is  central enlargement of the pulmonary arteries consistent with pulmonary arterial hypertension. No acute vascular findings on noncontrast imaging. The heart size is normal. There is no pericardial effusion.  Mediastinum/Nodes: There are no enlarged mediastinal, hilar or axillary lymph nodes.Hilar assessment is limited by the lack of intravenous contrast, although the hilar contours appear unchanged. 8 mm low-density right thyroid nodule on image 11/2 is stable. The trachea and esophagus appear unremarkable.  Lungs/Pleura: There is no pleural effusion or pneumothorax. There is stable mild to moderate centrilobular emphysema with biapical scarring. Postsurgical changes from previous wedge resection in the left lower lobe are stable. 5 mm left upper lobe nodule on image 23/3 is stable. There  are no suspicious findings in the left lung. In the right upper lobe, there is a new irregular nodule measuring 10 x 9 mm on axial image 27/3. This extends approximately 2.8 cm on sagittal image 70/6 and is favored to be inflammatory. Additional tiny right upper lobe nodules on images 30 and 31 of series 3 are stable. No other new or enlarging nodules.  Upper abdomen: The visualized upper abdomen appears stable without suspicious findings. There is a cyst in the interpolar region of the right kidney.  Musculoskeletal/Chest wall: There is no chest wall mass or suspicious osseous finding. Stable asymmetric density superiorly in the right breast on image 68/2.  IMPRESSION: 1. New ill-defined right upper lobe pulmonary nodule, favored to be infectious/inflammatory in etiology based on appearance/shape. Short-term CT follow-up (3-6 months) recommended. 2. Additional scattered small pulmonary nodules are stable and likely benign. 3. No adenopathy or other acute findings. 4. Aortic Atherosclerosis (ICD10-I70.0) and Emphysema (ICD10-J43.9).   Electronically Signed   By: Richardean Sale M.D.   On: 11/06/2018 15:07 I personally reviewed the CT images and concur with the findings noted above  Impression: Beverly Francis is a 78 year old woman with a history of tobacco abuse, COPD, lung cancer, thoracic aortic atherosclerosis and a 4.1 cm ascending aneurysm.  Stage Ia non-small cell carcinoma-now 5 years out from surgery.  No evidence of recurrence.  Lung nodules-she has multiple nodules bilaterally.  There is a new nodule in the right upper lobe.  This is likely infectious or inflammatory in nature.  She had a similar process back in the fall 2019 with a left upper lobe nodule.  Could also go along with her symptoms of cough and congestion.  We will plan to repeat a CT in 6 months to follow-up on that nodule.  Aortic atherosclerosis/ascending aortic aneurysm-noncontrast CT but measures  the same as her previous scan.  Needs continued annual follow-up.  Plan: Return in 6 months with CT chest  Melrose Nakayama, MD [facility] [phone]

## 2018-12-13 DIAGNOSIS — Z0001 Encounter for general adult medical examination with abnormal findings: Secondary | ICD-10-CM | POA: Diagnosis not present

## 2018-12-13 DIAGNOSIS — C679 Malignant neoplasm of bladder, unspecified: Secondary | ICD-10-CM | POA: Diagnosis not present

## 2018-12-13 DIAGNOSIS — Z1389 Encounter for screening for other disorder: Secondary | ICD-10-CM | POA: Diagnosis not present

## 2018-12-13 DIAGNOSIS — I10 Essential (primary) hypertension: Secondary | ICD-10-CM | POA: Diagnosis not present

## 2019-04-11 ENCOUNTER — Other Ambulatory Visit: Payer: Self-pay | Admitting: *Deleted

## 2019-04-11 DIAGNOSIS — Z85118 Personal history of other malignant neoplasm of bronchus and lung: Secondary | ICD-10-CM

## 2019-04-11 DIAGNOSIS — R911 Solitary pulmonary nodule: Secondary | ICD-10-CM

## 2019-04-29 ENCOUNTER — Other Ambulatory Visit: Payer: Self-pay | Admitting: Thoracic Surgery (Cardiothoracic Vascular Surgery)

## 2019-04-29 DIAGNOSIS — R911 Solitary pulmonary nodule: Secondary | ICD-10-CM

## 2019-04-29 DIAGNOSIS — Z85118 Personal history of other malignant neoplasm of bronchus and lung: Secondary | ICD-10-CM

## 2019-05-07 ENCOUNTER — Other Ambulatory Visit: Payer: Self-pay

## 2019-05-07 ENCOUNTER — Ambulatory Visit
Admission: RE | Admit: 2019-05-07 | Discharge: 2019-05-07 | Disposition: A | Discharge status: Home / Self Care | Payer: Medicare Other | Source: Ambulatory Visit | Attending: Thoracic Surgery (Cardiothoracic Vascular Surgery) | Admitting: Thoracic Surgery (Cardiothoracic Vascular Surgery)

## 2019-05-07 ENCOUNTER — Encounter: Payer: Self-pay | Admitting: Thoracic Surgery (Cardiothoracic Vascular Surgery)

## 2019-05-07 ENCOUNTER — Other Ambulatory Visit: Payer: Medicare Other

## 2019-05-07 ENCOUNTER — Ambulatory Visit: Payer: Medicare Other | Admitting: Thoracic Surgery (Cardiothoracic Vascular Surgery)

## 2019-05-07 VITALS — BP 149/82 | HR 90 | Temp 97.7°F | Resp 20 | Ht 63.5 in | Wt 100.0 lb

## 2019-05-07 DIAGNOSIS — R911 Solitary pulmonary nodule: Secondary | ICD-10-CM

## 2019-05-07 DIAGNOSIS — I700 Atherosclerosis of aorta: Secondary | ICD-10-CM

## 2019-05-07 DIAGNOSIS — I7121 Aneurysm of the ascending aorta, without rupture: Secondary | ICD-10-CM

## 2019-05-07 DIAGNOSIS — I712 Thoracic aortic aneurysm, without rupture: Secondary | ICD-10-CM

## 2019-05-07 DIAGNOSIS — Z85118 Personal history of other malignant neoplasm of bronchus and lung: Secondary | ICD-10-CM

## 2019-05-07 NOTE — Progress Notes (Signed)
[address]       [place] [address]             [phone]     HPI: Ms. Beverly Francis returns for a scheduled follow-up visit.  [name] is a 79 year old woman with a history of tobacco abuse (quit in 2015), COPD, lung cancer, thoracic aortic atherosclerosis, hypertension, and a 4.1 cm ascending aneurysm.  She had a left lower lobe superior segmentectomy for stage Ia non-small cell carcinoma in September 2015.  I last saw her in the office in September 2020.  She has been followed with CT scans for multiple lung nodules and a 4.1 cm ascending aneurysm.  She has had waxing and waning lung nodules over the past several years.  On her most recent visit there was a new right upper lobe nodule.  In the interim since her last visit she has been feeling about the same.  She continues to have some cough which is productive of clear sputum.  She has not had any fevers, chills, or night sweats.  Her appetite remains poor.  She has not had any significant weight change.  Past Medical History:  Diagnosis Date  . Anxiety    takes Xanax daily as needed  . Arthritis   . Ascending aortic aneurysm (HCC)    Mild  . Bladder cancer ([place])   . Cataracts, bilateral   . COPD (chronic obstructive pulmonary disease) (HCC)    Advair daily and Ventolin as needed  . History of colon polyps   . Hypertension    takes Lisinopril daily  . Lung cancer ([place])   . Lung mass    left  . Nocturia   . Osteoporosis   . Shortness of breath    with exertion  . Urinary frequency      Current Outpatient Medications  Medication Sig Dispense Refill  . aspirin EC 81 MG tablet Take 81 mg by mouth daily.    Marland Kitchen EPIPEN 2-PAK 0.3 MG/0.3ML SOAJ injection Inject 0.3 mg into the muscle once.     Marland Kitchen ibuprofen (ADVIL,MOTRIN) 200 MG tablet Take 400-600 mg by mouth 2 (two) times daily as needed for headache or moderate pain.    Marland Kitchen lisinopril-hydrochlorothiazide (PRINZIDE,ZESTORETIC) 10-12.5 MG per tablet  Take 1 tablet by mouth daily.   2  . Multiple Vitamin (MULTIVITAMIN WITH MINERALS) TABS tablet Take 1 tablet by mouth daily.    . TRELEGY ELLIPTA 100-62.5-25 MCG/INH AEPB INHALE 1 PUFF INTO THE LUNGS DAILY 60 each 3  . VENTOLIN HFA 108 (90 Base) MCG/ACT inhaler Inhale 2 puffs into the lungs every 6 (six) hours as needed for wheezing or shortness of breath.   2   No current facility-administered medications for this visit.    Physical Exam BP (!) 149/82 (BP Location: Right Arm, Patient Position: Sitting, Cuff Size: Normal)   Pulse 90   Temp 97.7 F (36.5 C) (Temporal)   Resp 20   Ht 5' 3.5" (1.613 m)   Wt 100 lb (45.4 kg)   SpO2 91% Comment: RA  BMI 17.63 kg/m  79 year old woman in no acute distress Alert and oriented x3 with no focal deficits Kyphoscoliosis No cervical supraclavicular adenopathy Lungs diminished bilaterally, clear with no wheezing Cardiac regular rate and rhythm  Diagnostic Tests: CT CHEST WITHOUT CONTRAST  TECHNIQUE: Multidetector CT imaging of the chest was performed following the standard protocol without IV contrast.  COMPARISON:  11/06/2018  FINDINGS: Cardiovascular: The heart size is within  normal limits. No pericardial effusion. Mild increase caliber of the ascending thoracic aorta which measures 4.1 cm, image 77/2. LAD, left circumflex and RCA coronary artery calcifications.  Mediastinum/Nodes: No enlarged mediastinal or axillary lymph nodes. Thyroid gland, trachea, and esophagus demonstrate no significant findings.  Lungs/Pleura: Advanced changes of centrilobular and paraseptal emphysema. No pleural effusion, airspace consolidation or atelectasis.  -Spiculated nodule within the posterior right upper lobe measures 2.0 x 1.8 cm, image 47/8. This is a new finding compared with previous exam.  -Solid spiculated nodule in the superior segment of right lower lobe measures 0.6 cm, image 72/8. Also new from previous exam.  -3 mm left  upper lobe lung nodule is stable, image 25/8.  -Index nodule in the medial right upper lobe measures 0.5 x 0.6 cm, image 33/8. Previously 1.0 x 0.8 cm.  Stable postsurgical change within the left lower lobe without evidence for tumor recurrence.  Upper Abdomen: Right kidney cyst measures 2.4 cm. Aortic atherosclerosis with branch vessel disease.  Musculoskeletal: No chest wall mass or suspicious bone lesions identified. T3 superior endplate compression deformity is again noted.  IMPRESSION: 1. The index nodule within the right upper lobe has decreased in size from previous exam compatible with a benign abnormality. Left upper lobe index nodule is stable. 2. There is a new large spiculated nodule in the posterior right upper lobe measuring 2 cm. Also new is a 6 mm spiculated nodule in the superior segment right lower lobe. Suspicious. Cannot rule out neoplasm. 3. Coronary artery calcifications 4. Mild dilatation of the ascending thoracic aorta. Recommend annual imaging followup by CTA or MRA. This recommendation follows 2010 ACCF/AHA/AATS/ACR/ASA/SCA/SCAI/SIR/STS/SVM Guidelines for the Diagnosis and Management of Patients with Thoracic Aortic Disease. Circulation. 2010; 121: Z610-R604. Aortic aneurysm NOS (ICD10-I71.9)  Aortic Atherosclerosis (ICD10-I70.0) and Emphysema (ICD10-J43.9).   Electronically Signed   By: Kerby Moors M.D.   On: 05/07/2019 09:20 I personally reviewed the CT images and concur with the findings noted above  Impression: Beverly Francis is a 79 year old woman with a remote history of tobacco abuse, COPD, lung cancer, hypertension, thoracic aortic atherosclerosis, and an ascending aneurysm.  Ascending aneurysm-stable needs continued CT follow-up annually.  Lung nodules-continues to have waxing and waning lung nodules.  She has a new 2 cm nodule in the right upper lobe that has an appearance that is concerning for non-small cell carcinoma.  If  she did not have a history of nodules that pop up and then resolve, I would be even more concerned.  I do not think we can wait 6 months to follow that up.  I offered the options of bronchoscopy with biopsy versus a short interval follow-up at 8 weeks.  She does not want to have a bronchoscopy at this time.  Hypertension-blood pressure elevated with a systolic of 540.  She has been taking her Zestoretic.  She has a cuff at home but has not been using it.  She has a follow-up with her primary in a month.  I recommended that she check her blood pressure on a regular basis over the next month and then can discuss whether or not additional medications are warranted when she sees her primary.  Plan: Return in 2 months with repeat CT of chest.  Will use super D protocol in case bronchoscopy is indicated.  Melrose Nakayama, MD [facility] [phone]

## 2019-05-14 ENCOUNTER — Other Ambulatory Visit: Payer: Self-pay | Admitting: *Deleted

## 2019-05-14 DIAGNOSIS — R911 Solitary pulmonary nodule: Secondary | ICD-10-CM

## 2019-06-11 DIAGNOSIS — I10 Essential (primary) hypertension: Secondary | ICD-10-CM | POA: Diagnosis not present

## 2019-06-11 DIAGNOSIS — C3490 Malignant neoplasm of unspecified part of unspecified bronchus or lung: Secondary | ICD-10-CM | POA: Diagnosis not present

## 2019-06-11 DIAGNOSIS — I712 Thoracic aortic aneurysm, without rupture: Secondary | ICD-10-CM | POA: Diagnosis not present

## 2019-06-11 DIAGNOSIS — J449 Chronic obstructive pulmonary disease, unspecified: Secondary | ICD-10-CM | POA: Diagnosis not present

## 2019-06-13 ENCOUNTER — Other Ambulatory Visit: Payer: Medicare Other

## 2019-06-18 ENCOUNTER — Ambulatory Visit: Payer: Medicare Other | Admitting: Thoracic Surgery (Cardiothoracic Vascular Surgery)

## 2019-07-02 ENCOUNTER — Ambulatory Visit
Admission: RE | Admit: 2019-07-02 | Discharge: 2019-07-02 | Disposition: A | Discharge status: Home / Self Care | Payer: Medicare Other | Source: Ambulatory Visit | Attending: Thoracic Surgery (Cardiothoracic Vascular Surgery) | Admitting: Thoracic Surgery (Cardiothoracic Vascular Surgery)

## 2019-07-02 DIAGNOSIS — R911 Solitary pulmonary nodule: Secondary | ICD-10-CM

## 2019-07-02 DIAGNOSIS — R918 Other nonspecific abnormal finding of lung field: Secondary | ICD-10-CM | POA: Diagnosis not present

## 2019-07-09 ENCOUNTER — Encounter: Payer: Self-pay | Admitting: Thoracic Surgery (Cardiothoracic Vascular Surgery)

## 2019-07-09 ENCOUNTER — Ambulatory Visit: Payer: Medicare Other | Admitting: Thoracic Surgery (Cardiothoracic Vascular Surgery)

## 2019-07-09 ENCOUNTER — Other Ambulatory Visit: Payer: Self-pay

## 2019-07-09 VITALS — BP 130/93 | HR 114 | Temp 97.3°F | Resp 18 | Ht 63.0 in | Wt 100.0 lb

## 2019-07-09 DIAGNOSIS — Z85118 Personal history of other malignant neoplasm of bronchus and lung: Secondary | ICD-10-CM

## 2019-07-09 DIAGNOSIS — R911 Solitary pulmonary nodule: Secondary | ICD-10-CM

## 2019-07-09 DIAGNOSIS — C3492 Malignant neoplasm of unspecified part of left bronchus or lung: Secondary | ICD-10-CM

## 2019-07-09 NOTE — Progress Notes (Signed)
[address]       [place] [address]             [phone]      HPI: Ms. Mcginty returns for a scheduled follow-up visit  Beverly Francis is a 79 year old woman with a history of heavy tobacco abuse prior to quitting in 2015.  The past medical history significant for COPD, lung cancer, multiple lung nodules, thoracic aortic atherosclerosis, hypertension, and a 4.1 cm ascending thoracic aortic aneurysm.  She had a left lower lobe superior segmentectomy for a stage Ia non-small cell carcinoma in September 2015.  She has been followed with CT scans for the aneurysm and for multiple waxing and waning lung nodules.  I last saw her in the office on 05/07/2019.  Her CT showed a new 2.1 x 1.8 cm spiculated right upper lobe lung nodule.  There were multiple other nodules that were similar.  There also was a new 6 mm nodule in the superior segment of the right lower lobe.  She refused bronchoscopy at that time and so we scheduled her for short-term interval follow-up.  She says that her respiratory status has not been right since she had Covid last March.  She has a chronic cough productive of clear sputum.  She has not had any recent fevers chills or night sweats.  No significant weight change.  Appetite remains poor.  Past Medical History:  Diagnosis Date  . Anxiety    takes Xanax daily as needed  . Arthritis   . Ascending aortic aneurysm (HCC)    Mild  . Bladder cancer ([place])   . Cataracts, bilateral   . COPD (chronic obstructive pulmonary disease) (HCC)    Advair daily and Ventolin as needed  . History of colon polyps   . Hypertension    takes Lisinopril daily  . Lung cancer ([place])   . Lung mass    left  . Nocturia   . Osteoporosis   . Shortness of breath    with exertion  . Urinary frequency     Current Outpatient Medications  Medication Sig Dispense Refill  . aspirin EC 81 MG tablet Take 81 mg by mouth daily.    Marland Kitchen EPIPEN 2-PAK 0.3 MG/0.3ML SOAJ injection  Inject 0.3 mg into the muscle once.     Marland Kitchen ibuprofen (ADVIL,MOTRIN) 200 MG tablet Take 400-600 mg by mouth 2 (two) times daily as needed for headache or moderate pain.    Marland Kitchen lisinopril-hydrochlorothiazide (PRINZIDE,ZESTORETIC) 10-12.5 MG per tablet Take 1 tablet by mouth daily.   2  . Multiple Vitamin (MULTIVITAMIN WITH MINERALS) TABS tablet Take 1 tablet by mouth daily.    . TRELEGY ELLIPTA 100-62.5-25 MCG/INH AEPB INHALE 1 PUFF INTO THE LUNGS DAILY 60 each 3  . VENTOLIN HFA 108 (90 Base) MCG/ACT inhaler Inhale 2 puffs into the lungs every 6 (six) hours as needed for wheezing or shortness of breath.   2   No current facility-administered medications for this visit.    Physical Exam BP (!) 130/93 (BP Location: Right Arm, Patient Position: Sitting, Cuff Size: Small)   Pulse (!) 114   Temp (!) 97.3 F (36.3 C)   Resp 18   Ht 5\' 3"  (1.6 m)   Wt 100 lb (45.4 kg)   SpO2 93% Comment: RA  BMI 17.75 kg/m  79 year old woman in no acute distress Alert and oriented x3 with no focal deficits Kyphoscoliosis Lungs with diminished breath sounds bilaterally, faint wheezing bilaterally  Cardiac tachycardic and regular  Diagnostic Tests: CT CHEST WITHOUT CONTRAST  TECHNIQUE: Multidetector CT imaging of the chest was performed following the standard protocol without IV contrast.  COMPARISON:  05/07/2019 chest CT.  FINDINGS: Cardiovascular: Normal heart size. No significant pericardial effusion/thickening. Left main and 3 vessel coronary atherosclerosis. Atherosclerotic thoracic aorta with stable ectatic 4.1 cm ascending thoracic aorta. Stable top-normal main pulmonary artery (3.2 cm diameter).  Mediastinum/Nodes: Stable subcentimeter hypodense posterior right thyroid nodule. Not clinically significant; no follow-up imaging recommended (ref: J Am Coll Radiol. 2015 Feb;12(2): 143-50). Unremarkable esophagus. No pathologically enlarged axillary, mediastinal or hilar lymph nodes, noting  limited sensitivity for the detection of hilar adenopathy on this noncontrast study.  Lungs/Pleura: No pneumothorax. No pleural effusion. Severe centrilobular emphysema. Stable left lower lobe wedge resection. Irregular posterior right upper lobe 1.8 x 1.5 cm solid pulmonary nodule (series 5/ image 47), previously 2.1 x 1.8 cm on 05/07/2019 chest CT using similar measurement technique, mildly decreased, new since 11/06/2018 chest CT. Irregular 0.6 cm medial right upper lobe pulmonary nodule (series 5/image 35), previously 0.6 cm using similar measurement technique, stable. Irregular 0.7 cm solid pulmonary nodule in the superior segment right lower lobe (series 5/image 73), previously 0.7 cm on 05/07/2019 chest CT using similar measurement technique, stable, new since 11/06/2018 chest CT. Apical left upper lobe 0.4 cm solid pulmonary nodule (series 5/image 20), previously 0.4 cm using similar measurement technique, stable. No acute interval consolidative airspace disease. No new significant pulmonary nodules.  Upper abdomen: Simple 2.4 cm lateral interpolar right renal cyst.  Musculoskeletal: No aggressive appearing focal osseous lesions. Mild thoracic spondylosis.  IMPRESSION: 1. Dominant irregular posterior right upper lobe 1.8 cm solid pulmonary nodule is mildly decreased since 05/07/2019 chest CT, potentially inflammatory. Superior segment right lower lobe 0.7 cm irregular solid pulmonary nodule is stable since 05/07/2019 chest CT and new since 11/06/2018 chest CT, indeterminate. Suggest continued close chest CT surveillance in 3 months in this high risk patient. 2. No thoracic adenopathy. 3. Left main and 3 vessel coronary atherosclerosis. 4. Stable ectatic 4.1 cm ascending thoracic aorta. Recommend annual imaging followup by CTA or MRA. This recommendation follows 2010 ACCF/AHA/AATS/ACR/ASA/SCA/SCAI/SIR/STS/SVM Guidelines for the Diagnosis and Management of Patients with  Thoracic Aortic Disease. Circulation. 2010; 121: N397-Q734. Aortic aneurysm NOS (ICD10-I71.9) 5. Aortic Atherosclerosis (ICD10-I70.0) and Emphysema (ICD10-J43.9).   Electronically Signed   By: Ilona Sorrel M.D.   On: 07/02/2019 10:17 I personally reviewed the CT images and concur with the findings noted above  Impression: Beverly Francis is a 79 year old woman with a history of tobacco abuse and COPD.  She also has a history of a stage Ia nonsmall cell lung cancer treated with left lower lobe superior segmentectomy in 2015.  She has been followed with CTs since then for a 4.1 cm ascending aneurysm and multiple waxing and waning lung nodules.  Ascending aneurysm-stable at 4.1 cm.  Needs continued annual follow-up.  Lung nodules.  The relatively new dominant right upper lobe nodule measure smaller on her current exam.  Previously measured 2.1 x 1.8 cm.  Now measures 1.8 x 1.5 cm.  A nodule in the right lower lobe superior segment is about the same at 7 mm.  She has had multiple waxing and waning nodules over the years.  We again discussed the options of bronchoscopy for biopsy versus continued radiographic follow-up.  She is refusing bronchoscopy at this time, but does agree to radiographic follow-up.  Plan: Return in 3 months with CT chest  Remo Lipps  Chaya Jan, MD [facility] [phone]

## 2019-09-27 ENCOUNTER — Other Ambulatory Visit: Payer: Self-pay | Admitting: Thoracic Surgery (Cardiothoracic Vascular Surgery)

## 2019-09-27 DIAGNOSIS — R911 Solitary pulmonary nodule: Secondary | ICD-10-CM

## 2019-10-17 ENCOUNTER — Other Ambulatory Visit: Payer: Medicare Other

## 2019-10-22 ENCOUNTER — Ambulatory Visit: Payer: Medicare Other | Admitting: Thoracic Surgery (Cardiothoracic Vascular Surgery)

## 2019-10-25 ENCOUNTER — Other Ambulatory Visit: Payer: Medicare Other

## 2019-10-29 ENCOUNTER — Other Ambulatory Visit: Payer: Medicare Other

## 2019-10-30 ENCOUNTER — Ambulatory Visit
Admission: RE | Admit: 2019-10-30 | Discharge: 2019-10-30 | Disposition: A | Discharge status: Home / Self Care | Payer: Medicare Other | Source: Ambulatory Visit | Attending: Thoracic Surgery (Cardiothoracic Vascular Surgery) | Admitting: Thoracic Surgery (Cardiothoracic Vascular Surgery)

## 2019-10-30 DIAGNOSIS — I700 Atherosclerosis of aorta: Secondary | ICD-10-CM | POA: Diagnosis not present

## 2019-10-30 DIAGNOSIS — I2510 Atherosclerotic heart disease of native coronary artery without angina pectoris: Secondary | ICD-10-CM | POA: Diagnosis not present

## 2019-10-30 DIAGNOSIS — J432 Centrilobular emphysema: Secondary | ICD-10-CM | POA: Diagnosis not present

## 2019-10-30 DIAGNOSIS — I712 Thoracic aortic aneurysm, without rupture: Secondary | ICD-10-CM | POA: Diagnosis not present

## 2019-10-30 DIAGNOSIS — R911 Solitary pulmonary nodule: Secondary | ICD-10-CM

## 2019-11-05 ENCOUNTER — Ambulatory Visit: Payer: Medicare Other | Admitting: Thoracic Surgery (Cardiothoracic Vascular Surgery)

## 2019-11-05 ENCOUNTER — Other Ambulatory Visit: Payer: Self-pay

## 2019-11-05 VITALS — BP 117/71 | HR 88 | Resp 20 | Ht 63.0 in | Wt 100.0 lb

## 2019-11-05 DIAGNOSIS — R911 Solitary pulmonary nodule: Secondary | ICD-10-CM | POA: Diagnosis not present

## 2019-11-05 NOTE — Progress Notes (Signed)
[address]       [place] [address]             [phone]     HPI: Beverly Francis returns for a scheduled follow-up  Beverly Francis is a 79 year old woman with a history of heavy tobacco abuse (quit 2015), COPD, stage Ia lung cancer, multiple lung nodules, thoracic aortic atherosclerosis, hypertension, and a 4.1 cm ascending aneurysm.  I did a left lower lobe superior segmentectomy for stage Ia non-small cell carcinoma in 2015.  During her follow-up she was found to have a 4.1 cm ascending aneurysm.  We have been following both with CT scans.  In March of this year she had a new 2.1 x 1.8 cm spiculated right upper lobe nodule.  There were multiple other nodules that were unchanged.  She refused bronchoscopy and we have been following her since then.  In May the upper lobe nodule was slightly smaller.  In the interim since her last visit she lost her life partner of the last 9 years.  She is very depressed about that.  She has not been doing well from a respiratory standpoint.  She is got a productive cough.  She thinks she probably had COVID-19 back in March even though she is vaccinated.  Past Medical History:  Diagnosis Date  . Anxiety    takes Xanax daily as needed  . Arthritis   . Ascending aortic aneurysm (HCC)    Mild  . Bladder cancer ([place])   . Cataracts, bilateral   . COPD (chronic obstructive pulmonary disease) (HCC)    Advair daily and Ventolin as needed  . History of colon polyps   . Hypertension    takes Lisinopril daily  . Lung cancer ([place])   . Lung mass    left  . Nocturia   . Osteoporosis   . Shortness of breath    with exertion  . Urinary frequency       Current Outpatient Medications  Medication Sig Dispense Refill  . aspirin EC 81 MG tablet Take 81 mg by mouth daily.    Marland Kitchen EPIPEN 2-PAK 0.3 MG/0.3ML SOAJ injection Inject 0.3 mg into the muscle once.     Marland Kitchen ibuprofen (ADVIL,MOTRIN) 200 MG tablet Take 400-600 mg by mouth 2 (two)  times daily as needed for headache or moderate pain.    Marland Kitchen lisinopril-hydrochlorothiazide (PRINZIDE,ZESTORETIC) 10-12.5 MG per tablet Take 1 tablet by mouth daily.   2  . Multiple Vitamin (MULTIVITAMIN WITH MINERALS) TABS tablet Take 1 tablet by mouth daily.    . TRELEGY ELLIPTA 100-62.5-25 MCG/INH AEPB INHALE 1 PUFF INTO THE LUNGS DAILY 60 each 3  . VENTOLIN HFA 108 (90 Base) MCG/ACT inhaler Inhale 2 puffs into the lungs every 6 (six) hours as needed for wheezing or shortness of breath.   2   No current facility-administered medications for this visit.    Physical Exam BP 117/71   Pulse 88   Resp 20   Ht 5\' 3"  (1.6 m)   Wt 100 lb (45.4 kg)   SpO2 93% Comment: RA  BMI 17.71 kg/m  Thin 79 year old woman in no acute distress Alert and oriented x3 with no focal deficits Tearful Lungs with diminished breath sounds and faint wheezes bilaterally Cardiac regular rate and rhythm  Diagnostic Tests: CT CHEST WITHOUT CONTRAST  TECHNIQUE: Multidetector CT imaging of the chest was performed using thin slice collimation for electromagnetic bronchoscopy planning purposes, without intravenous contrast.  COMPARISON:  07/02/2019  FINDINGS: Cardiovascular: Stable the heart is normal in size. No pericardial effusion.  Stable 4.1 cm ascending thoracic aortic aneurysm (coronal image 36). Atherosclerotic calcifications of the aortic arch.  Enlargement of the right main pulmonary artery, suggesting pulmonary arterial hypertension.  Three vessel coronary atherosclerosis.  Mediastinum/Nodes: No suspicious mediastinal lymphadenopathy.  Visualized thyroid is unremarkable.  Lungs/Pleura: New 9 x 13 mm nodule at the medial left lung apex (series 8/image 16).  Continued decrease in size of the dominant posterior right upper lobe nodule, now measuring 10 x 16 mm (series 8/image 40), previously 15 x 18 mm.  6 mm right lower lobe nodule (series 8/image 68), previously 7 mm, grossly  unchanged.  7 mm lateral right lower lobe nodule (series 8/image 133), new.  Additional smaller nodular opacities bilaterally.  Overall, this conforms to the pattern of a waxing/waning appearance, favoring atypical infection over tumor.  Status post left lower lobe wedge resection.  Moderate centrilobular and paraseptal emphysematous changes.  No pleural effusion or pneumothorax.  Upper Abdomen: Visualized upper abdomen is notable for a dominant 3.0 cm right renal cyst, vascular calcifications, and mild gastric wall thickening which may reflect underdistention.  Musculoskeletal: Moderate superior endplate compression fracture deformity at L3, mildly progressive.  IMPRESSION: Multiple waxing/waning pulmonary nodules, which continues to favor atypical infection over tumor, as described above.  Dominant posterior right upper lobe nodule now measures 10 x 16 mm, previously 15 x 18 mm. However, there is a new 9 x 13 mm nodule at the medial left lung apex.  Given the clinical history, continued follow-up is suggested in 6-12 months.  Stable 4.1 cm ascending thoracic aortic aneurysm. Given the follow-up recommendation above, attention on follow-up is suggested. This recommendation follows 2010 ACCF/AHA/AATS/ACR/ASA/SCA/SCAI/SIR/STS/SVM Guidelines for the Diagnosis and Management of Patients with Thoracic Aortic Disease. Circulation. 2010; 121: Q330-Q762. Aortic aneurysm NOS (ICD10-I71.9)  Aortic Atherosclerosis (ICD10-I70.0) and Emphysema (ICD10-J43.9).   Electronically Signed   By: Julian Hy M.D.   On: 10/30/2019 08:52 I personally reviewed the CT images and concur with the findings noted above  Impression: Beverly Francis is a 79 year old woman with a history of heavy tobacco abuse (quit 2015), COPD, stage Ia lung cancer, multiple lung nodules, thoracic aortic atherosclerosis, hypertension, and a 4.1 cm ascending aneurysm.  Multiple lung  nodules-the right upper lobe nodule is again slightly smaller.  She has a new left upper lobe nodule that is about 9 x 13 mm.  There was no hint of anything in that area on her previous scan.  That would be extremely unusual for a neoplasm.  I agree with the radiology assessment this is for more likely to be atypical infection.  She is not interested in a bronchoscopy and says that even if it was lung cancer she would not have any treatment.  COPD-she has been having a lot of difficulty with shortness of breath and secretions lately but no fevers or chills.  Managed by Dr. Delfina Redwood.  Plan: Return in 3 months with CT chest  I spent 20 minutes in review of records, review of images, and with Mrs. Herzberg today. Melrose Nakayama, MD [facility] [phone]

## 2019-12-06 DEATH — deceased

## 2019-12-18 ENCOUNTER — Other Ambulatory Visit: Payer: Self-pay | Admitting: *Deleted

## 2019-12-18 DIAGNOSIS — R918 Other nonspecific abnormal finding of lung field: Secondary | ICD-10-CM

## 2020-01-28 ENCOUNTER — Other Ambulatory Visit: Payer: Medicare Other

## 2020-02-04 ENCOUNTER — Encounter: Payer: Self-pay | Admitting: Thoracic Surgery (Cardiothoracic Vascular Surgery)
